# Patient Record
Sex: Male | Born: 1980 | Race: White | Hispanic: No | Marital: Married | Smoking: Former smoker
Health system: Southern US, Community
[De-identification: ages and names within clinical notes are randomized; demographics above are authoritative.]

## PROBLEM LIST (undated history)

## (undated) DIAGNOSIS — F909 Attention-deficit hyperactivity disorder, unspecified type: Secondary | ICD-10-CM

## (undated) DIAGNOSIS — N200 Calculus of kidney: Secondary | ICD-10-CM

## (undated) DIAGNOSIS — M545 Low back pain, unspecified: Secondary | ICD-10-CM

## (undated) DIAGNOSIS — G8929 Other chronic pain: Secondary | ICD-10-CM

## (undated) DIAGNOSIS — M1990 Unspecified osteoarthritis, unspecified site: Secondary | ICD-10-CM

## (undated) HISTORY — PX: HERNIA REPAIR: SHX51

## (undated) HISTORY — DX: Unspecified osteoarthritis, unspecified site: M19.90

## (undated) HISTORY — DX: Attention-deficit hyperactivity disorder, unspecified type: F90.9

---

## 2004-12-31 ENCOUNTER — Emergency Department: Payer: Self-pay | Admitting: General Practice

## 2005-06-14 ENCOUNTER — Emergency Department: Payer: Self-pay | Admitting: Emergency Medicine

## 2005-10-23 ENCOUNTER — Emergency Department: Payer: Self-pay | Admitting: Emergency Medicine

## 2005-10-25 ENCOUNTER — Emergency Department: Payer: Self-pay | Admitting: Emergency Medicine

## 2005-10-26 ENCOUNTER — Emergency Department: Payer: Self-pay | Admitting: Emergency Medicine

## 2006-01-14 ENCOUNTER — Emergency Department: Payer: Self-pay | Admitting: Emergency Medicine

## 2006-02-21 ENCOUNTER — Emergency Department: Payer: Self-pay | Admitting: Emergency Medicine

## 2006-03-06 ENCOUNTER — Emergency Department: Payer: Self-pay | Admitting: Emergency Medicine

## 2006-08-31 ENCOUNTER — Emergency Department: Payer: Self-pay | Admitting: Emergency Medicine

## 2007-01-28 ENCOUNTER — Emergency Department: Payer: Self-pay | Admitting: Emergency Medicine

## 2009-03-16 ENCOUNTER — Emergency Department: Payer: Self-pay | Admitting: Emergency Medicine

## 2009-08-04 ENCOUNTER — Emergency Department (HOSPITAL_COMMUNITY): Admission: EM | Admit: 2009-08-04 | Discharge: 2009-08-04 | Payer: Self-pay | Admitting: Emergency Medicine

## 2009-08-05 ENCOUNTER — Emergency Department (HOSPITAL_COMMUNITY): Admission: EM | Admit: 2009-08-05 | Discharge: 2009-08-05 | Payer: Self-pay | Admitting: Emergency Medicine

## 2009-08-07 ENCOUNTER — Emergency Department (HOSPITAL_COMMUNITY): Admission: EM | Admit: 2009-08-07 | Discharge: 2009-08-07 | Payer: Self-pay | Admitting: Emergency Medicine

## 2009-08-18 ENCOUNTER — Emergency Department: Payer: Self-pay | Admitting: Emergency Medicine

## 2009-08-31 ENCOUNTER — Emergency Department (HOSPITAL_COMMUNITY): Admission: EM | Admit: 2009-08-31 | Discharge: 2009-08-31 | Payer: Self-pay | Admitting: Emergency Medicine

## 2009-09-01 ENCOUNTER — Emergency Department (HOSPITAL_COMMUNITY): Admission: EM | Admit: 2009-09-01 | Discharge: 2009-09-01 | Payer: Self-pay | Admitting: Emergency Medicine

## 2009-09-13 ENCOUNTER — Emergency Department (HOSPITAL_COMMUNITY): Admission: EM | Admit: 2009-09-13 | Discharge: 2009-09-13 | Payer: Self-pay | Admitting: Emergency Medicine

## 2009-10-23 ENCOUNTER — Emergency Department: Payer: Self-pay

## 2009-10-26 ENCOUNTER — Emergency Department: Payer: Self-pay | Admitting: Emergency Medicine

## 2009-10-28 ENCOUNTER — Emergency Department: Payer: Self-pay | Admitting: Internal Medicine

## 2010-05-27 ENCOUNTER — Inpatient Hospital Stay: Payer: Self-pay | Admitting: Psychiatry

## 2010-06-02 ENCOUNTER — Emergency Department: Payer: Self-pay | Admitting: Emergency Medicine

## 2010-11-03 ENCOUNTER — Emergency Department (HOSPITAL_COMMUNITY)
Admission: EM | Admit: 2010-11-03 | Discharge: 2010-11-03 | Payer: Self-pay | Source: Home / Self Care | Admitting: Emergency Medicine

## 2010-12-09 ENCOUNTER — Emergency Department (HOSPITAL_COMMUNITY)
Admission: EM | Admit: 2010-12-09 | Discharge: 2010-12-09 | Payer: Self-pay | Source: Home / Self Care | Admitting: Emergency Medicine

## 2011-02-13 ENCOUNTER — Emergency Department (HOSPITAL_COMMUNITY)
Admission: EM | Admit: 2011-02-13 | Discharge: 2011-02-13 | Disposition: A | Discharge status: Home / Self Care | Payer: Self-pay | Attending: Emergency Medicine | Admitting: Emergency Medicine

## 2011-02-13 DIAGNOSIS — M545 Low back pain, unspecified: Secondary | ICD-10-CM | POA: Insufficient documentation

## 2011-02-13 DIAGNOSIS — G8929 Other chronic pain: Secondary | ICD-10-CM | POA: Insufficient documentation

## 2011-02-13 DIAGNOSIS — R109 Unspecified abdominal pain: Secondary | ICD-10-CM | POA: Insufficient documentation

## 2011-02-13 DIAGNOSIS — R197 Diarrhea, unspecified: Secondary | ICD-10-CM | POA: Insufficient documentation

## 2011-02-13 LAB — URINALYSIS, ROUTINE W REFLEX MICROSCOPIC
Bilirubin Urine: NEGATIVE
Hgb urine dipstick: NEGATIVE
Protein, ur: NEGATIVE mg/dL
Specific Gravity, Urine: 1.024 (ref 1.005–1.030)
Urobilinogen, UA: 1 mg/dL (ref 0.0–1.0)
pH: 7 (ref 5.0–8.0)

## 2011-02-23 LAB — POCT CARDIAC MARKERS
Myoglobin, poc: 65.7 ng/mL (ref 12–200)
Troponin i, poc: <0.05 ng/mL (ref 0.00–0.09)

## 2011-02-23 LAB — D-DIMER, QUANTITATIVE: D-Dimer, Quant: 0.24 ug/mL-FEU (ref 0.00–0.48)

## 2011-02-24 LAB — CBC
HCT: 40.9 % (ref 39.0–52.0)
HCT: 43.3 % (ref 39.0–52.0)
Hemoglobin: 14.9 g/dL (ref 13.0–17.0)
MCHC: 34.3 g/dL (ref 30.0–36.0)
MCHC: 34.5 g/dL (ref 30.0–36.0)
MCV: 92.2 fL (ref 78.0–100.0)
Platelets: 255 K/uL (ref 150–400)
RBC: 4.69 MIL/uL (ref 4.22–5.81)
RDW: 12.2 % (ref 11.5–15.5)
RDW: 12.9 % (ref 11.5–15.5)
WBC: 14 K/uL — ABNORMAL HIGH (ref 4.0–10.5)

## 2011-02-24 LAB — URINALYSIS, ROUTINE W REFLEX MICROSCOPIC
Bilirubin Urine: NEGATIVE
Glucose, UA: NEGATIVE mg/dL
Ketones, ur: NEGATIVE mg/dL
Ketones, ur: NEGATIVE mg/dL
Leukocytes, UA: NEGATIVE
Leukocytes, UA: NEGATIVE
Nitrite: NEGATIVE
Protein, ur: NEGATIVE mg/dL
Protein, ur: NEGATIVE mg/dL
Urobilinogen, UA: 0.2 mg/dL (ref 0.0–1.0)
pH: 8 (ref 5.0–8.0)

## 2011-02-24 LAB — POCT I-STAT, CHEM 8
BUN: 16 mg/dL (ref 6–23)
BUN: 6 mg/dL (ref 6–23)
Calcium, Ion: 1.13 mmol/L (ref 1.12–1.32)
Calcium, Ion: 1.16 mmol/L (ref 1.12–1.32)
Chloride: 106 mEq/L (ref 96–112)
Chloride: 107 meq/L (ref 96–112)
Creatinine, Ser: 0.8 mg/dL (ref 0.4–1.5)
Creatinine, Ser: 0.8 mg/dL (ref 0.4–1.5)
Glucose, Bld: 152 mg/dL — ABNORMAL HIGH (ref 70–99)
Glucose, Bld: 94 mg/dL (ref 70–99)
HCT: 44 % (ref 39.0–52.0)
Hemoglobin: 15 g/dL (ref 13.0–17.0)
Potassium: 3.6 meq/L (ref 3.5–5.1)
Sodium: 141 meq/L (ref 135–145)
TCO2: 22 mmol/L (ref 0–100)
TCO2: 25 mmol/L (ref 0–100)

## 2011-02-24 LAB — BASIC METABOLIC PANEL
CO2: 25 mEq/L (ref 19–32)
Calcium: 8.9 mg/dL (ref 8.4–10.5)
Chloride: 109 mEq/L (ref 96–112)
GFR calc non Af Amer: >60 mL/min (ref >60)
Glucose, Bld: 95 mg/dL (ref 70–99)

## 2011-02-24 LAB — URINE MICROSCOPIC-ADD ON

## 2011-02-24 LAB — DIFFERENTIAL
Basophils Absolute: 0.1 K/uL (ref 0.0–0.1)
Basophils Relative: 0 % (ref 0–1)
Eosinophils Absolute: 0 10*3/uL (ref 0.0–0.7)
Eosinophils Relative: 0 % (ref 0–5)
Eosinophils Relative: 1 % (ref 0–5)
Lymphocytes Relative: 8 % — ABNORMAL LOW (ref 12–46)
Lymphs Abs: 1.1 K/uL (ref 0.7–4.0)
Lymphs Abs: 1.4 10*3/uL (ref 0.7–4.0)
Monocytes Absolute: 0.7 10*3/uL (ref 0.1–1.0)
Monocytes Absolute: 0.7 K/uL (ref 0.1–1.0)
Monocytes Relative: 5 % (ref 3–12)
Neutro Abs: 12.1 10*3/uL — ABNORMAL HIGH (ref 1.7–7.7)
Neutrophils Relative %: 78 % — ABNORMAL HIGH (ref 43–77)
Neutrophils Relative %: 87 % — ABNORMAL HIGH (ref 43–77)

## 2011-02-25 ENCOUNTER — Inpatient Hospital Stay (INDEPENDENT_AMBULATORY_CARE_PROVIDER_SITE_OTHER)
Admission: RE | Admit: 2011-02-25 | Discharge: 2011-02-25 | Disposition: A | Discharge status: Home / Self Care | Payer: Self-pay | Source: Ambulatory Visit | Attending: Emergency Medicine | Admitting: Emergency Medicine

## 2011-02-25 DIAGNOSIS — M5430 Sciatica, unspecified side: Secondary | ICD-10-CM

## 2011-02-25 DIAGNOSIS — M549 Dorsalgia, unspecified: Secondary | ICD-10-CM

## 2011-05-08 ENCOUNTER — Inpatient Hospital Stay (INDEPENDENT_AMBULATORY_CARE_PROVIDER_SITE_OTHER)
Admission: RE | Admit: 2011-05-08 | Discharge: 2011-05-08 | Disposition: A | Discharge status: Home / Self Care | Payer: Self-pay | Source: Ambulatory Visit

## 2011-05-08 DIAGNOSIS — S335XXA Sprain of ligaments of lumbar spine, initial encounter: Secondary | ICD-10-CM

## 2011-05-16 ENCOUNTER — Emergency Department (HOSPITAL_COMMUNITY)
Admission: EM | Admit: 2011-05-16 | Discharge: 2011-05-16 | Discharge status: Against Medical Advice | Payer: Self-pay | Attending: Emergency Medicine | Admitting: Emergency Medicine

## 2011-05-16 DIAGNOSIS — M542 Cervicalgia: Secondary | ICD-10-CM | POA: Insufficient documentation

## 2011-05-29 ENCOUNTER — Emergency Department (HOSPITAL_COMMUNITY)
Admission: EM | Admit: 2011-05-29 | Discharge: 2011-05-30 | Disposition: A | Discharge status: Home / Self Care | Payer: Self-pay | Attending: Emergency Medicine | Admitting: Emergency Medicine

## 2011-05-29 DIAGNOSIS — M549 Dorsalgia, unspecified: Secondary | ICD-10-CM | POA: Insufficient documentation

## 2011-05-29 DIAGNOSIS — M62838 Other muscle spasm: Secondary | ICD-10-CM | POA: Insufficient documentation

## 2011-07-24 ENCOUNTER — Inpatient Hospital Stay (INDEPENDENT_AMBULATORY_CARE_PROVIDER_SITE_OTHER)
Admit: 2011-07-24 | Discharge: 2011-07-24 | Disposition: A | Discharge status: Home / Self Care | Payer: BC Managed Care – PPO

## 2011-07-24 ENCOUNTER — Emergency Department (HOSPITAL_COMMUNITY)
Admission: EM | Admit: 2011-07-24 | Discharge: 2011-07-24 | Discharge status: Against Medical Advice | Payer: BC Managed Care – PPO | Attending: Emergency Medicine | Admitting: Emergency Medicine

## 2011-07-24 DIAGNOSIS — M545 Low back pain, unspecified: Secondary | ICD-10-CM | POA: Insufficient documentation

## 2011-07-24 DIAGNOSIS — M5430 Sciatica, unspecified side: Secondary | ICD-10-CM

## 2011-09-04 ENCOUNTER — Emergency Department (HOSPITAL_COMMUNITY)
Admission: EM | Admit: 2011-09-04 | Discharge: 2011-09-04 | Disposition: A | Discharge status: Home / Self Care | Payer: BC Managed Care – PPO | Attending: Emergency Medicine | Admitting: Emergency Medicine

## 2011-09-04 ENCOUNTER — Emergency Department (HOSPITAL_COMMUNITY): Payer: BC Managed Care – PPO

## 2011-09-04 DIAGNOSIS — R109 Unspecified abdominal pain: Secondary | ICD-10-CM | POA: Insufficient documentation

## 2011-09-04 DIAGNOSIS — N200 Calculus of kidney: Secondary | ICD-10-CM | POA: Insufficient documentation

## 2011-09-04 DIAGNOSIS — R112 Nausea with vomiting, unspecified: Secondary | ICD-10-CM | POA: Insufficient documentation

## 2011-09-04 DIAGNOSIS — R197 Diarrhea, unspecified: Secondary | ICD-10-CM | POA: Insufficient documentation

## 2011-09-04 LAB — DIFFERENTIAL
Basophils Absolute: 0 10*3/uL (ref 0.0–0.1)
Basophils Relative: 0 % (ref 0–1)
Eosinophils Absolute: 0.1 10*3/uL (ref 0.0–0.7)
Lymphs Abs: 1.3 10*3/uL (ref 0.7–4.0)
Monocytes Relative: 6 % (ref 3–12)

## 2011-09-04 LAB — COMPREHENSIVE METABOLIC PANEL
Albumin: 5.2 g/dL (ref 3.5–5.2)
BUN: 10 mg/dL (ref 6–23)
CO2: 24 mEq/L (ref 19–32)
Calcium: 10.8 mg/dL — ABNORMAL HIGH (ref 8.4–10.5)
Chloride: 101 mEq/L (ref 96–112)
Creatinine, Ser: 0.6 mg/dL (ref 0.50–1.35)
Glucose, Bld: 100 mg/dL — ABNORMAL HIGH (ref 70–99)
Sodium: 137 mEq/L (ref 135–145)
Total Protein: 8 g/dL (ref 6.0–8.3)

## 2011-09-04 LAB — URINALYSIS, ROUTINE W REFLEX MICROSCOPIC
Nitrite: NEGATIVE
Protein, ur: NEGATIVE mg/dL

## 2011-09-04 LAB — CBC
HCT: 48.4 % (ref 39.0–52.0)
MCH: 31.6 pg (ref 26.0–34.0)
MCV: 85.4 fL (ref 78.0–100.0)
Platelets: 286 10*3/uL (ref 150–400)

## 2011-09-04 LAB — POCT I-STAT, CHEM 8
Chloride: 104 mEq/L (ref 96–112)
HCT: 54 % — ABNORMAL HIGH (ref 39.0–52.0)
Hemoglobin: 18.4 g/dL — ABNORMAL HIGH (ref 13.0–17.0)
Sodium: 139 mEq/L (ref 135–145)

## 2011-09-04 MED ORDER — IOHEXOL 300 MG/ML  SOLN
80.0000 mL | Freq: Once | INTRAMUSCULAR | Status: AC | PRN
  Administered 2011-09-04: 80 mL via INTRAVENOUS

## 2011-09-10 ENCOUNTER — Inpatient Hospital Stay (INDEPENDENT_AMBULATORY_CARE_PROVIDER_SITE_OTHER)
Admission: RE | Admit: 2011-09-10 | Discharge: 2011-09-10 | Disposition: A | Discharge status: Home / Self Care | Payer: BC Managed Care – PPO | Source: Ambulatory Visit | Attending: Family Medicine | Admitting: Family Medicine

## 2011-09-10 DIAGNOSIS — N200 Calculus of kidney: Secondary | ICD-10-CM

## 2011-09-10 LAB — POCT URINALYSIS DIP (DEVICE)
Bilirubin Urine: NEGATIVE
Ketones, ur: NEGATIVE mg/dL
Protein, ur: 30 mg/dL — AB
Specific Gravity, Urine: >=1.03 (ref 1.005–1.030)
pH: 6.5 (ref 5.0–8.0)

## 2011-09-10 LAB — RAPID URINE DRUG SCREEN, HOSP PERFORMED
Amphetamines: NOT DETECTED
Benzodiazepines: NOT DETECTED
Tetrahydrocannabinol: NOT DETECTED

## 2011-11-04 ENCOUNTER — Encounter: Payer: Self-pay | Admitting: General Practice

## 2011-11-04 ENCOUNTER — Emergency Department (HOSPITAL_COMMUNITY)
Admission: EM | Admit: 2011-11-04 | Discharge: 2011-11-04 | Disposition: A | Discharge status: Home / Self Care | Payer: BC Managed Care – PPO | Attending: Emergency Medicine | Admitting: Emergency Medicine

## 2011-11-04 DIAGNOSIS — M545 Low back pain, unspecified: Secondary | ICD-10-CM | POA: Insufficient documentation

## 2011-11-04 DIAGNOSIS — M79609 Pain in unspecified limb: Secondary | ICD-10-CM | POA: Insufficient documentation

## 2011-11-04 DIAGNOSIS — G8929 Other chronic pain: Secondary | ICD-10-CM | POA: Insufficient documentation

## 2011-11-04 MED ORDER — IBUPROFEN 600 MG PO TABS: 600.0000 mg | ORAL_TABLET | Freq: Four times a day (QID) | ORAL | Status: AC | PRN

## 2011-11-04 MED ORDER — CYCLOBENZAPRINE HCL 10 MG PO TABS: 10.0000 mg | ORAL_TABLET | Freq: Two times a day (BID) | ORAL | Status: AC | PRN

## 2011-11-04 MED ORDER — TRAMADOL HCL 50 MG PO TABS: 50.0000 mg | ORAL_TABLET | Freq: Four times a day (QID) | ORAL | Status: AC | PRN

## 2011-11-04 NOTE — ED Notes (Signed)
Pt has low back pain. Pt states that he has had same x 1 week.  Pt uses heaving equipment at work.  Pt is carrying an infant without distress.  Pt denies any known injury.  Pt states that pain does radiate to his right thigh.

## 2011-11-04 NOTE — ED Notes (Signed)
Pt c/o of lower back x 1 week. Denies any injuries. C/o of shooting pains down right leg.

## 2011-11-04 NOTE — ED Notes (Signed)
Pt states wants to leave AMA d/t has infant with him and wants to go home.  Requested for pt to wait for Dr Radford Pax - pt voiced he will.

## 2011-11-04 NOTE — ED Notes (Signed)
Dr Radford Pax aware pt is requesting to leave.

## 2011-11-04 NOTE — ED Provider Notes (Signed)
History     CSN: [id number] Arrival date & time: 11/04/2011  1:09 PM   First MD Initiated Contact with Patient 11/04/11 1425      Chief Complaint  Patient presents with  . Back Pain    (Consider location/radiation/quality/duration/timing/severity/associated sxs/prior treatment) Patient is a 30 y.o. male presenting with back pain.  Back Pain    Pt c/o of lower back x 1 week. Denies any injuries. C/o of shooting pains down right leg.  Patient has had pain summers this in the past.  Has not had an MRI scan done.  Patient is going to see his doctor next week.  History reviewed. No pertinent past medical history.  History reviewed. No pertinent past surgical history.  History reviewed. No pertinent family history.  History  Substance Use Topics  . Smoking status: Not on file  . Smokeless tobacco: Not on file  . Alcohol Use: Not on file      Review of Systems  Musculoskeletal: Positive for back pain.  All other systems reviewed and are negative.    Allergies  Review of patient's allergies indicates no known allergies.  Home Medications   Current Outpatient Rx  Name Route Sig Dispense Refill  . IBUPROFEN 200 MG PO TABS Oral Take 600 mg by mouth every 6 (six) hours as needed. FOR PAIN     . ADULT MULTIVITAMIN W/MINERALS CH Oral Take 1 tablet by mouth daily.      . CYCLOBENZAPRINE HCL 10 MG PO TABS Oral Take 1 tablet (10 mg total) by mouth 2 (two) times daily as needed for muscle spasms. 20 tablet 0  . IBUPROFEN 600 MG PO TABS Oral Take 1 tablet (600 mg total) by mouth every 6 (six) hours as needed for pain. 30 tablet 0  . TRAMADOL HCL 50 MG PO TABS Oral Take 1 tablet (50 mg total) by mouth every 6 (six) hours as needed for pain. Maximum dose= 8 tablets per day 15 tablet 0    BP 151/80  Pulse 85  Temp 98.2 F (36.8 C)  Resp 16  SpO2 100%  Physical Exam  Nursing note and vitals reviewed. Constitutional: He is oriented to person, place, and time. He appears  well-developed and well-nourished. No distress.  HENT:  Head: Normocephalic and atraumatic.  Eyes: Pupils are equal, round, and reactive to light.  Neck: Normal range of motion.  Cardiovascular: Normal rate and intact distal pulses.   Pulmonary/Chest: No respiratory distress.  Abdominal: Normal appearance. He exhibits no distension.  Musculoskeletal: Normal range of motion.       Lumbar back: He exhibits tenderness and pain. He exhibits no swelling.       Back:  Neurological: He is alert and oriented to person, place, and time. No cranial nerve deficit.  Skin: Skin is warm and dry. No rash noted.  Psychiatric: He has a normal mood and affect. His behavior is normal.    ED Course  Procedures (including critical care time) I recommended that the patient have an MRI scan for further definitive diagnostic work. Labs Reviewed - No data to display No results found.   1. Chronic back pain       MDM          Nelia Shi, MD 11/04/11 [phone]

## 2012-02-03 ENCOUNTER — Emergency Department (HOSPITAL_COMMUNITY)
Admission: EM | Admit: 2012-02-03 | Discharge: 2012-02-03 | Disposition: A | Discharge status: Home / Self Care | Payer: BC Managed Care – PPO | Attending: Emergency Medicine | Admitting: Emergency Medicine

## 2012-02-03 ENCOUNTER — Encounter (HOSPITAL_COMMUNITY): Payer: Self-pay | Admitting: Nurse Practitioner

## 2012-02-03 DIAGNOSIS — T148XXA Other injury of unspecified body region, initial encounter: Secondary | ICD-10-CM

## 2012-02-03 DIAGNOSIS — Z87442 Personal history of urinary calculi: Secondary | ICD-10-CM | POA: Insufficient documentation

## 2012-02-03 DIAGNOSIS — R109 Unspecified abdominal pain: Secondary | ICD-10-CM | POA: Insufficient documentation

## 2012-02-03 DIAGNOSIS — M549 Dorsalgia, unspecified: Secondary | ICD-10-CM | POA: Insufficient documentation

## 2012-02-03 HISTORY — DX: Calculus of kidney: N20.0

## 2012-02-03 LAB — URINALYSIS, ROUTINE W REFLEX MICROSCOPIC
Glucose, UA: NEGATIVE mg/dL
Hgb urine dipstick: NEGATIVE
Ketones, ur: NEGATIVE mg/dL
Leukocytes, UA: NEGATIVE
Protein, ur: NEGATIVE mg/dL
Urobilinogen, UA: 0.2 mg/dL (ref 0.0–1.0)

## 2012-02-03 MED ORDER — TRAMADOL HCL 50 MG PO TABS: 50.0000 mg | ORAL_TABLET | Freq: Four times a day (QID) | ORAL | Status: DC | PRN

## 2012-02-03 MED ORDER — CYCLOBENZAPRINE HCL 10 MG PO TABS: 10.0000 mg | ORAL_TABLET | Freq: Three times a day (TID) | ORAL | Status: AC | PRN

## 2012-02-03 MED ORDER — KETOROLAC TROMETHAMINE 30 MG/ML IJ SOLN: 30.0000 mg | Freq: Once | INTRAMUSCULAR | Status: DC

## 2012-02-03 MED ORDER — IBUPROFEN 800 MG PO TABS
800.0000 mg | ORAL_TABLET | Freq: Once | ORAL | Status: AC
  Administered 2012-02-03: 800 mg via ORAL
  Filled 2012-02-03: qty 1

## 2012-02-03 NOTE — ED Notes (Signed)
Opt reports R buttock pain radiating down R leg rates 7/10 will medicate for pain but pt reports he is driving narcotics avoided at this time.

## 2012-02-03 NOTE — ED Notes (Signed)
C/o lower back pain radiating down R leg x 1 week. Gets worse with urination and movement. History of kidney stones but states this feels more like back pain.

## 2012-02-03 NOTE — ED Notes (Signed)
Patient is speaking with the PA.  He originally declined his x-ray, but now he is reconsidering having it done.

## 2012-02-03 NOTE — ED Provider Notes (Signed)
History     CSN: [id number]  Arrival date & time 02/03/12  1731   First MD Initiated Contact with Patient 02/03/12 2012      Chief Complaint  Patient presents with  . Back Pain     HPI  History provided by the patient. Patient is a 31 year old male with history of kidney stones and hernia repair who presents with complaints of right flank and back pains for the past week. Patient reports the pain is worse with certain movements and bending. Patient states he is uncomfortable in any position.  Pain feels somewhat similar to prior kidney stones.  Patient denies any trauma or strenuous activity. Patient denies any dysuria, hematuria, urinary frequency. Patient denies any fever, chills, sweats, nausea, vomiting. Patient denies any urinary or equal incontinence, urinary retention, perineal numbness, weakness or numbness in the lower extremities. Symptoms are described as severe. Patient denies any other aggravating or alleviating factors.   Past Medical History  Diagnosis Date  . Kidney stone     Past Surgical History  Procedure Date  . Hernia repair     History reviewed. No pertinent family history.  History  Substance Use Topics  . Smoking status: Current Everyday Smoker -- 1.0 packs/day  . Smokeless tobacco: Not on file  . Alcohol Use: No      Review of Systems  Constitutional: Negative for fever and chills.  Gastrointestinal: Negative for nausea, vomiting, abdominal pain, diarrhea and constipation.  Genitourinary: Negative for dysuria, frequency and hematuria.  Skin: Negative for rash.  All other systems reviewed and are negative.    Allergies  Review of patient's allergies indicates no known allergies.  Home Medications   Current Outpatient Rx  Name Route Sig Dispense Refill  . ACETAMINOPHEN 500 MG PO TABS Oral Take 1,000 mg by mouth every 6 (six) hours as needed. For pain.    . IBUPROFEN 200 MG PO TABS Oral Take 600 mg by mouth every 6 (six) hours as needed.  FOR PAIN    . ADULT MULTIVITAMIN W/MINERALS CH Oral Take 1 tablet by mouth daily.        BP 132/78  Pulse 97  Temp(Src) 99.2 F (37.3 C) (Oral)  Resp 16  Ht 5\' 11"  (1.803 m)  Wt 160 lb (72.576 kg)  BMI 22.32 kg/m2  SpO2 99%  Physical Exam  Nursing note and vitals reviewed. Constitutional: He is oriented to person, place, and time. He appears well-developed and well-nourished. No distress.  HENT:  Head: Normocephalic.  Cardiovascular: Normal rate and regular rhythm.   Pulmonary/Chest: Effort normal and breath sounds normal.  Abdominal: Soft. He exhibits no distension. There is no tenderness. There is no rebound and no guarding.  Musculoskeletal:       Cervical back: Normal.       Thoracic back: He exhibits tenderness.       Lumbar back: He exhibits tenderness.       Back:       Reduced range of motion and low back secondary to pain. Normal gait.  Neurological: He is alert and oriented to person, place, and time.  Skin: Skin is warm. No rash noted.  Psychiatric: He has a normal mood and affect. His behavior is normal.    ED Course  Procedures   Results for orders placed during the hospital encounter of 02/03/12  URINALYSIS, ROUTINE W REFLEX MICROSCOPIC      Component Value Range   Color, Urine YELLOW  YELLOW    APPearance CLEAR  CLEAR    Specific Gravity, Urine 1.015  1.005 - 1.030    pH 7.5  5.0 - 8.0    Glucose, UA NEGATIVE  NEGATIVE (mg/dL)   Hgb urine dipstick NEGATIVE  NEGATIVE    Bilirubin Urine NEGATIVE  NEGATIVE    Ketones, ur NEGATIVE  NEGATIVE (mg/dL)   Protein, ur NEGATIVE  NEGATIVE (mg/dL)   Urobilinogen, UA 0.2  0.0 - 1.0 (mg/dL)   Nitrite NEGATIVE  NEGATIVE    Leukocytes, UA NEGATIVE  NEGATIVE        1. Right flank pain   2. Muscle strain       MDM  Patient seen and evaluated. Patient no acute distress.  Patient with unremarkable urine sample. Patient has no red flag warning symptoms back pain. I discussed with patient further workup to  rule out kidney stone despite having no blood in urine. Due to patient having multiple CT scans in the past I discussed the option for a plain film x-ray. Based on prior CT scans with most recent in last October patient did have a 1.1 cm nonobstructing stone in the right kidney. I also offered additional treatment of symptoms. Patient stated that he needed to return home to his wife and 2 kids. Patient stated that he made his followup if symptoms worsen and he is more time to spend in the emergency room. I also recommended that he followup with the urology specialist on Monday.    Medical screening examination/treatment/procedure(s) were performed by non-physician practitioner and as supervising physician I was immediately available for consultation/collaboration. Osvaldo Human, M.D.       Angus Seller, PA 02/05/12 0124  Carleene Cooper III, MD 02/06/12 1124

## 2012-02-03 NOTE — Discharge Instructions (Signed)
You were seen and evaluated for your right back and flank pains. Your urine test today was normal. Your providers discussed with you options for further testing including an x-ray to look for kidney stones and blood tests to evaluate your kidney function. You had declined to have further workup or treatment of your symptoms. At this time your providers recommend that you followup with your primary care provider or urology specialist on Monday for continued evaluation and treatment.   Flank Pain Flank pain refers to pain that is located on the side of the body between the upper abdomen and the back. It can be caused by many things. CAUSES  Some of the more common causes of flank pain include:  Muscle strain.   Muscle spasms.   A disease of your spine (vertebral disk disease).   A lung infection (pneumonia).   Fluid around your lungs (pulmonary edema).   A kidney infection.   Kidney stones.   A very painful skin rash on only one side of your body (shingles).   Gallbladder disease.  DIAGNOSIS  Blood tests, urine tests, and X-rays may help your caregiver determine what is wrong. TREATMENT  The treatment of pain depends on the cause. Your caregiver will determine what treatment will work best for you. HOME CARE INSTRUCTIONS   Home care will depend on the cause of your pain.   Some medications may help relieve the pain. Take medication for relief of pain as directed by your caregiver.   Tell your caregiver about any changes in your pain.   Follow up with your caregiver.  SEEK IMMEDIATE MEDICAL CARE IF:   Your pain is not controlled with medication.   The pain increases.   You have abdominal pain.   You have shortness of breath.   You have persistent nausea or vomiting.   You have swelling in your abdomen.   You feel faint or pass out.   You have a temperature by mouth above 102 F (38.9 C), not controlled by medicine.  MAKE SURE YOU:   Understand these  instructions.   Will watch your condition.   Will get help right away if you are not doing well or get worse.  Document Released: 12/28/2005 Document Revised: 10/26/2011 Document Reviewed: 04/23/2010 [facility] Patient Information 2012 [place], [place].   Muscle Strain A muscle strain (pulled muscle) happens when a muscle is over-stretched. Recovery usually takes 5 to 6 weeks.  HOME CARE   Put ice on the injured area.   Put ice in a plastic bag.   Place a towel between your skin and the bag.   Leave the ice on for 15 to 20 minutes at a time, every hour for the first 2 days.   Do not use the muscle for several days or until your doctor says you can. Do not use the muscle if you have pain.   Wrap the injured area with an elastic bandage for comfort. Do not put it on too tightly.   Only take medicine as told by your doctor.   Warm up before exercise. This helps prevent muscle strains.  GET HELP RIGHT AWAY IF:  There is increased pain or puffiness (swelling) in the affected area. MAKE SURE YOU:   Understand these instructions.   Will watch your condition.   Will get help right away if you are not doing well or get worse.  Document Released: 08/15/2008 Document Revised: 10/26/2011 Document Reviewed: 08/15/2008 [facility] Patient Information 2012 [place], [place].  RESOURCE GUIDE  Dental Problems  Patients with Medicaid: [facility] [phone]                                           [phone] [address] Phone:  [phone]                                                  Phone:  [phone]  If unable to pay or uninsured, contact:  [facility] or [facility]. to become qualified for the adult dental clinic.  Chronic Pain Problems Contact [facility]  [phone] Patients need to be referred by their primary care doctor.  Insufficient Money for Medicine Contact United Way:  call  "211" or [facility] (669)290-2073.  No Primary Care Doctor Call [facility]  831-470-0034 Other agencies that provide inexpensive medical care    [facility]  475-485-8822    [facility]  [phone]    [facility]  650-215-0631    [facility]  ([phone]    [facility]  (209) 079-5424    [facility]  [phone]  [facility]  [phone] [facility]  [phone] [facility]   [phone] (emergency services 601-281-7449)  Substance Abuse Resources [facility]  510-511-7102 [facility] (304)324-5824 [facility] 813-627-0301 Floydene Flock (609) 254-0830 Residential & Outpatient Substance Abuse Program  930-247-3284  Abuse/Neglect [facility] Child Abuse Hotline (351)791-1483 [facility] Child Abuse Hotline (309) 481-9243 (After Hours)  Emergency Shelter [facility] (505)172-6031  [facility] [place] [facility] (667)820-7197 [facility] 973-298-8372  MRSA Hotline #:   564-758-3170    [facility] Resources  [facility] [place]     United Way                          [facility] Dept. [address] [place]                       [address]      [address]  Blondell Reveal Phone:  [phone]                                   Phone:  [phone]                 Phone:  [phone]  [facility] Phone:  [phone]  [place] [phone] (After Hours)

## 2012-02-03 NOTE — ED Notes (Signed)
Patient is AOx4 and comfortable with his discharge instructions. 

## 2012-03-12 ENCOUNTER — Other Ambulatory Visit (HOSPITAL_BASED_OUTPATIENT_CLINIC_OR_DEPARTMENT_OTHER): Payer: Self-pay | Admitting: Internal Medicine

## 2012-03-12 DIAGNOSIS — M545 Low back pain: Secondary | ICD-10-CM

## 2012-03-15 ENCOUNTER — Other Ambulatory Visit (HOSPITAL_BASED_OUTPATIENT_CLINIC_OR_DEPARTMENT_OTHER): Payer: Self-pay | Admitting: Internal Medicine

## 2012-03-15 DIAGNOSIS — T1590XA Foreign body on external eye, part unspecified, unspecified eye, initial encounter: Secondary | ICD-10-CM

## 2012-03-16 ENCOUNTER — Other Ambulatory Visit (HOSPITAL_BASED_OUTPATIENT_CLINIC_OR_DEPARTMENT_OTHER): Payer: BC Managed Care – PPO

## 2012-03-23 ENCOUNTER — Other Ambulatory Visit (HOSPITAL_BASED_OUTPATIENT_CLINIC_OR_DEPARTMENT_OTHER): Payer: BC Managed Care – PPO

## 2012-03-23 ENCOUNTER — Inpatient Hospital Stay (HOSPITAL_BASED_OUTPATIENT_CLINIC_OR_DEPARTMENT_OTHER): Admission: RE | Admit: 2012-03-23 | Payer: BC Managed Care – PPO | Source: Ambulatory Visit

## 2012-03-30 ENCOUNTER — Ambulatory Visit (HOSPITAL_BASED_OUTPATIENT_CLINIC_OR_DEPARTMENT_OTHER): Admission: RE | Admit: 2012-03-30 | Payer: BC Managed Care – PPO | Source: Ambulatory Visit

## 2012-04-06 ENCOUNTER — Inpatient Hospital Stay (HOSPITAL_BASED_OUTPATIENT_CLINIC_OR_DEPARTMENT_OTHER): Admission: RE | Admit: 2012-04-06 | Payer: BC Managed Care – PPO | Source: Ambulatory Visit

## 2012-04-06 ENCOUNTER — Other Ambulatory Visit (HOSPITAL_BASED_OUTPATIENT_CLINIC_OR_DEPARTMENT_OTHER): Payer: BC Managed Care – PPO

## 2012-04-15 ENCOUNTER — Encounter (HOSPITAL_COMMUNITY): Payer: Self-pay | Admitting: Emergency Medicine

## 2012-04-15 ENCOUNTER — Emergency Department (HOSPITAL_COMMUNITY)
Admission: EM | Admit: 2012-04-15 | Discharge: 2012-04-15 | Disposition: A | Discharge status: Home / Self Care | Payer: BC Managed Care – PPO | Attending: Emergency Medicine | Admitting: Emergency Medicine

## 2012-04-15 ENCOUNTER — Emergency Department (HOSPITAL_COMMUNITY): Payer: BC Managed Care – PPO

## 2012-04-15 DIAGNOSIS — R110 Nausea: Secondary | ICD-10-CM | POA: Insufficient documentation

## 2012-04-15 DIAGNOSIS — R109 Unspecified abdominal pain: Secondary | ICD-10-CM | POA: Insufficient documentation

## 2012-04-15 DIAGNOSIS — M545 Low back pain, unspecified: Secondary | ICD-10-CM | POA: Insufficient documentation

## 2012-04-15 DIAGNOSIS — G8929 Other chronic pain: Secondary | ICD-10-CM | POA: Insufficient documentation

## 2012-04-15 HISTORY — DX: Low back pain, unspecified: M54.50

## 2012-04-15 HISTORY — DX: Other chronic pain: G89.29

## 2012-04-15 HISTORY — DX: Low back pain: M54.5

## 2012-04-15 LAB — DIFFERENTIAL
Basophils Absolute: 0 10*3/uL (ref 0.0–0.1)
Basophils Relative: 0 % (ref 0–1)
Eosinophils Absolute: 0.1 10*3/uL (ref 0.0–0.7)
Eosinophils Relative: 1 % (ref 0–5)
Lymphocytes Relative: 24 % (ref 12–46)
Monocytes Absolute: 0.8 10*3/uL (ref 0.1–1.0)

## 2012-04-15 LAB — URINALYSIS, ROUTINE W REFLEX MICROSCOPIC
Bilirubin Urine: NEGATIVE
Glucose, UA: 100 mg/dL — AB
Ketones, ur: NEGATIVE mg/dL
Leukocytes, UA: NEGATIVE
Protein, ur: NEGATIVE mg/dL
pH: 7 (ref 5.0–8.0)

## 2012-04-15 LAB — BASIC METABOLIC PANEL
BUN: 10 mg/dL (ref 6–23)
CO2: 26 mEq/L (ref 19–32)
Calcium: 10.3 mg/dL (ref 8.4–10.5)
Chloride: 102 mEq/L (ref 96–112)
Creatinine, Ser: 0.66 mg/dL (ref 0.50–1.35)
Glucose, Bld: 94 mg/dL (ref 70–99)

## 2012-04-15 LAB — CBC
HCT: 44.5 % (ref 39.0–52.0)
MCH: 30.7 pg (ref 26.0–34.0)
MCHC: 36.2 g/dL — ABNORMAL HIGH (ref 30.0–36.0)
MCV: 84.9 fL (ref 78.0–100.0)
Platelets: 296 10*3/uL (ref 150–400)
RDW: 12.7 % (ref 11.5–15.5)

## 2012-04-15 MED ORDER — ONDANSETRON HCL 4 MG/2ML IJ SOLN
4.0000 mg | Freq: Once | INTRAMUSCULAR | Status: AC
  Administered 2012-04-15: 4 mg via INTRAVENOUS
  Filled 2012-04-15: qty 2

## 2012-04-15 MED ORDER — HYDROMORPHONE HCL PF 1 MG/ML IJ SOLN: 1.0000 mg | Freq: Once | INTRAMUSCULAR | Status: DC

## 2012-04-15 MED ORDER — SODIUM CHLORIDE 0.9 % IV BOLUS (SEPSIS)
1000.0000 mL | Freq: Once | INTRAVENOUS | Status: AC
  Administered 2012-04-15: 1000 mL via INTRAVENOUS

## 2012-04-15 MED ORDER — KETOROLAC TROMETHAMINE 30 MG/ML IJ SOLN
30.0000 mg | Freq: Once | INTRAMUSCULAR | Status: AC
  Administered 2012-04-15: 30 mg via INTRAVENOUS
  Filled 2012-04-15: qty 1

## 2012-04-15 NOTE — ED Provider Notes (Signed)
I saw and evaluated the patient, reviewed the resident's note and I agree with the findings and plan.  I saw the patient along with Ihor Austin and agree with his note, assessment, and plan.  The patient presents complaining of pain in the bilateral flanks he believes is a kidney stone.  He has a history of this.  On exam, he is afebrile and vitals are stable.  The heart is rrr and the lungs are cta.  There is mild bilateral cva ttp.  Workup was initiated including ct of the abdomen and pelvis which was unremarkable and ua which was clear.  He was given toradol and is feeling better.  He will discharged to home with follow up with his pcp if worsens.   Geoffery Lyons, MD 04/15/12 2312

## 2012-04-15 NOTE — ED Notes (Signed)
MD at bedside. 

## 2012-04-15 NOTE — ED Notes (Addendum)
Patient complaining of intermittent lower abdominal pain which radiates around his sides and to flank area for the past two days; patient states that pain is worse upon waking up.  Patient reports history of kidney stones and states pain feels somewhat similar.  Patient states that pain is more prominent on left side/flank area.  Denies changes in urine.

## 2012-04-15 NOTE — Discharge Instructions (Signed)
Abdominal Pain Abdominal pain can be caused by many things. Your caregiver decides the seriousness of your pain by an examination and possibly blood tests and X-rays. Many cases can be observed and treated at home. Most abdominal pain is not caused by a disease and will probably improve without treatment. However, in many cases, more time must pass before a clear cause of the pain can be found. Before that point, it may not be known if you need more testing, or if hospitalization or surgery is needed. HOME CARE INSTRUCTIONS   Do not take laxatives unless directed by your caregiver.   Take pain medicine only as directed by your caregiver.   Only take over-the-counter or prescription medicines for pain, discomfort, or fever as directed by your caregiver.   Try a clear liquid diet (broth, tea, or water) for as long as directed by your caregiver. Slowly move to a bland diet as tolerated.  SEEK IMMEDIATE MEDICAL CARE IF:   The pain does not go away.   You have a fever.   You keep throwing up (vomiting).   The pain is felt only in portions of the abdomen. Pain in the right side could possibly be appendicitis. In an adult, pain in the left lower portion of the abdomen could be colitis or diverticulitis.   You pass bloody or black tarry stools.  MAKE SURE YOU:   Understand these instructions.   Will watch your condition.   Will get help right away if you are not doing well or get worse.  Document Released: 08/16/2005 Document Revised: 10/26/2011 Document Reviewed: 06/24/2008 [employer] Patient Information 2012 [employer] RESOURCE GUIDE  Dental Problems  Patients with Medicaid: [facility]                     [address]                                           Phone:  [phone]                                                  If unable to pay or uninsured, contact:  [facility] or [facility] to become qualified for the adult dental  clinic.  Chronic Pain Problems Contact [facility]  [phone] Patients need to be referred by their primary care doctor.  Insufficient Money for Medicine Contact United Way:  call "211" or [facility] 271-5999.  No Primary Care Doctor Call [facility]  832-8000 Other agencies that provide inexpensive medical care    [facility]  [phone]    [facility]  [phone]    [facility]  [phone]    [facility]'s Clinic  [phone]    [facility]  [phone]    [facility]  [phone]  Substance Abuse Resources Alcohol and Drug Services  336-882-2125 [facility] 336-784-9470 [facility] 336-285-9073 [facility] 336-845-3988 Residential & Outpatient Substance Abuse Program  800-659-3381  [facility] Services [facility]  832-9600 [facility]  [phone] [facility]   [phone] (emergency services [phone])  Abuse/Neglect [place] Child Abuse Hotline (336) 641-3795 [place]   Child Abuse Hotline 800-378-5315 (After Hours)  Emergency Shelter [facility] (336) 271-5985  [facility] (336) 275-9566 [facility] (704) 372-4663  MRSA Hotline #:   832-7006    [place] Resources  [facility] [place]  [employer] Way                           [facility] [address] [place]                     [address]         [address]  [place]                                               [place]                              [place] Phone:  [phone]                                  Phone:  [phone]                   Phone:  [phone]  [place] [facility] Phone:  [phone]  [place] Child Abuse Hotline (336) 342-1394 (336) 342-3537 (After Hours) 

## 2012-04-15 NOTE — ED Provider Notes (Signed)
History     CSN: [id number]  Arrival date & time 04/15/12  4098   First MD Initiated Contact with Patient 04/15/12 2006      Chief Complaint  Patient presents with  . Flank Pain    (Consider location/radiation/quality/duration/timing/severity/associated sxs/prior treatment) HPI Comments: Pain in bilateral flanks.  L > R that feels like prior kidney stones.  Radiating to groin.  Some nausea.  No diarrhea.  Good PO intake and otherwise doing well.  No dysuria.  Patient is a 31 y.o. male presenting with flank pain. The history is provided by the patient.  Flank Pain This is a new problem. The current episode started today. The problem occurs constantly. The problem has been unchanged. Associated symptoms include abdominal pain (bilateral flanks and lower abdomen). Pertinent negatives include no arthralgias, chest pain, congestion, fatigue, fever, headaches, nausea, rash or vomiting. The symptoms are aggravated by nothing. He has tried nothing for the symptoms.    Past Medical History  Diagnosis Date  . Kidney stone   . Chronic lower back pain     Past Surgical History  Procedure Date  . Hernia repair     History reviewed. No pertinent family history.  History  Substance Use Topics  . Smoking status: Current Everyday Smoker -- 1.0 packs/day  . Smokeless tobacco: Not on file  . Alcohol Use: No      Review of Systems  Constitutional: Negative for fever, activity change and fatigue.  HENT: Negative for congestion.   Eyes: Negative for pain.  Respiratory: Negative for chest tightness, shortness of breath, wheezing and stridor.   Cardiovascular: Negative for chest pain and leg swelling.  Gastrointestinal: Positive for abdominal pain (bilateral flanks and lower abdomen). Negative for nausea and vomiting.  Genitourinary: Positive for flank pain. Negative for dysuria.  Musculoskeletal: Negative for arthralgias.  Skin: Negative for rash.  Neurological: Negative for headaches.   Psychiatric/Behavioral: Negative for behavioral problems.    Allergies  Review of patient's allergies indicates no known allergies.  Home Medications   Current Outpatient Rx  Name Route Sig Dispense Refill  . IBUPROFEN 200 MG PO TABS Oral Take 600 mg by mouth every 6 (six) hours as needed. FOR PAIN    . ADULT MULTIVITAMIN W/MINERALS CH Oral Take 1 tablet by mouth daily.        BP 144/82  Pulse 84  Temp(Src) 98.3 F (36.8 C) (Oral)  Resp 18  SpO2 99%  Physical Exam  Constitutional: He is oriented to person, place, and time. He appears well-developed and well-nourished. No distress.  HENT:  Head: Normocephalic and atraumatic.  Eyes: Conjunctivae and EOM are normal. Pupils are equal, round, and reactive to light. No scleral icterus.  Neck: Normal range of motion. Neck supple.  Cardiovascular: Normal rate and regular rhythm.  Exam reveals no gallop and no friction rub.   No murmur heard. Pulmonary/Chest: Effort normal and breath sounds normal. No respiratory distress. He has no wheezes. He has no rales. He exhibits no tenderness.  Abdominal: Soft. He exhibits no distension and no mass. There is tenderness (mild ttp in bilateral flank and across lower abdomen.). There is no rebound and no guarding.  Genitourinary:       No testicular ttp.  No hernia.  Musculoskeletal: Normal range of motion. He exhibits no edema and no tenderness.  Neurological: He is alert and oriented to person, place, and time. He has normal reflexes. No cranial nerve deficit. He exhibits normal muscle tone. Coordination normal.  Skin:  Skin is warm and dry. No rash noted. He is not diaphoretic. No erythema.  Psychiatric: He has a normal mood and affect. His behavior is normal. Judgment and thought content normal.    ED Course  Procedures (including critical care time)  Labs Reviewed  URINALYSIS, ROUTINE W REFLEX MICROSCOPIC - Abnormal; Notable for the following:    Glucose, UA 100 (*)    All other  components within normal limits  BASIC METABOLIC PANEL - Abnormal; Notable for the following:    Potassium 3.4 (*)    All other components within normal limits  CBC - Abnormal; Notable for the following:    WBC 10.7 (*)    MCHC 36.2 (*)    All other components within normal limits  DIFFERENTIAL   Ct Abdomen Pelvis Wo Contrast  04/15/2012  *RADIOLOGY REPORT*  Clinical Data: Right and left flank pain.  Left greater than right. Question of stone.  History of prior stones.  CT ABDOMEN AND PELVIS WITHOUT CONTRAST  Technique:  Multidetector CT imaging of the abdomen and pelvis was performed following the standard protocol without intravenous contrast.  Comparison: 09/04/2011  Findings: Lung bases are clear.  Intrarenal calculi are identified on the right, nonobstructing.  No evidence for ureteral stone. There is a parenchymal calcification of the left, stable in appearance.  Hyperdense lesion is identified in the left kidney that appears stable over multiple prior studies and likely represents a cyst.  No hydronephrosis.  No focal abnormality identified within the liver, spleen, pancreas, or adrenal glands.  The gallbladder is present.  The stomach and small bowel loops have a normal appearance. The appendix is well seen and has a normal appearance.  Colonic loops are normal in appearance. No retroperitoneal or mesenteric adenopathy. No evidence for aortic aneurysm.  IMPRESSION:  1.  No evidence for obstructing ureteral stones. 2.  Intrarenal calculi on the right. 3.  Stable parenchymal calcification and hyperdense cyst in the left kidney. 4.  No evidence for acute appendicitis, bowel obstruction.  Original Report Authenticated By: Patterson Hammersmith, M.D.     1. Abdominal pain   2. Flank pain       MDM  Pain in bilateral flanks.  L > R that feels like prior kidney stones.  Radiating to groin.  Some nausea.  No diarrhea.  Good PO intake and otherwise doing well.  No dysuria.  VSS and well appearing.   Treated pain and hydrated.  Stone study, labs unconcerning.  Only mild leukocytosis present.  No GI symptoms to suggest enteritis.  Will have pt f/u with PCP.  Pt comfortable with plan and will follow up.         Army Chaco, MD 04/15/12 2133

## 2012-06-20 ENCOUNTER — Ambulatory Visit (INDEPENDENT_AMBULATORY_CARE_PROVIDER_SITE_OTHER): Payer: BC Managed Care – PPO | Admitting: Family Medicine

## 2012-06-20 ENCOUNTER — Ambulatory Visit: Payer: BC Managed Care – PPO

## 2012-06-20 VITALS — BP 122/78 | HR 91 | Temp 98.9°F | Resp 17 | Ht 70.0 in | Wt 156.0 lb

## 2012-06-20 DIAGNOSIS — T148XXA Other injury of unspecified body region, initial encounter: Secondary | ICD-10-CM

## 2012-06-20 DIAGNOSIS — M549 Dorsalgia, unspecified: Secondary | ICD-10-CM

## 2012-06-20 MED ORDER — METHOCARBAMOL 500 MG PO TABS: 500.0000 mg | ORAL_TABLET | Freq: Three times a day (TID) | ORAL | Status: DC

## 2012-06-20 MED ORDER — METHYLPREDNISOLONE 4 MG PO KIT: PACK | ORAL | Status: AC

## 2012-06-20 MED ORDER — HYDROCODONE-ACETAMINOPHEN 5-500 MG PO TABS
1.0000 | ORAL_TABLET | Freq: Three times a day (TID) | ORAL | Status: AC | PRN
Start: 2012-06-20 — End: 2012-06-30

## 2012-06-20 MED ORDER — NAPROXEN 500 MG PO TABS: 500.0000 mg | ORAL_TABLET | Freq: Two times a day (BID) | ORAL | Status: DC

## 2012-06-20 NOTE — Progress Notes (Signed)
[facility]:  Office Visit  Chief Complaint:  Chief Complaint  Patient presents with  . Back Pain    HPI: Dustin Wright is a 31 y.o. male who complains of  1 week history of back pain and right lower buttock. No numbnessor tingling, has had prior back pain. Denies loss of bowel or bladder fxn. 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 Certain ROM hurts his back. Tried tylenol and advil without relief.   Past Medical History  Diagnosis Date  . Kidney stone   . Chronic lower back pain    Past Surgical History  Procedure Date  . Hernia repair    History   Social History  . Marital Status: Married    Spouse Name: N/A    Number of Children: N/A  . Years of Education: N/A   Social History Main Topics  . Smoking status: Current Everyday Smoker -- 1.0 packs/day for 11 years    Types: Cigarettes  . Smokeless tobacco: None  . Alcohol Use: No  . Drug Use: No  . Sexually Active: None   Other Topics Concern  . None   Social History Narrative  . None   No family history on file. No Known Allergies Prior to Admission medications   Medication Sig Start Date End Date Taking? Authorizing Provider  ibuprofen (ADVIL,MOTRIN) 200 MG tablet Take 600 mg by mouth every 6 (six) hours as needed. FOR PAIN   Yes Historical Provider, MD  Multiple Vitamin (MULITIVITAMIN WITH MINERALS) TABS Take 1 tablet by mouth daily.     Yes Historical Provider, MD     ROS: The patient denies fevers, chills, night sweats, unintentional weight loss, chest pain, palpitations, wheezing, dyspnea on exertion, nausea, vomiting, abdominal pain, dysuria, hematuria, melena, numbness, weakness, or tingling.   All other systems have been reviewed and were otherwise negative with the exception of those mentioned in the HPI and as above.    PHYSICAL EXAM: Filed Vitals:   06/20/12 1712  BP: 122/78  Pulse: 91  Temp: 98.9 F (37.2 C)  Resp: 17    Filed Vitals:   06/20/12 1712  Height: 5\' 10"  (1.778 m)  Weight: 156 lb (70.761 kg)   Body mass index is 22.38 kg/(m^2).  General: Alert, no acute distress HEENT:  Normocephalic, atraumatic, oropharynx patent.  Cardiovascular:  Regular rate and rhythm, no rubs murmurs or gallops.  No Carotid bruits, radial pulse intact. No pedal edema.  Respiratory: Clear to auscultation bilaterally.  No wheezes, rales, or rhonchi.  No cyanosis, no use of accessory musculature GI: No organomegaly, abdomen is soft and non-tender, positive bowel sounds.  No masses. Skin: No rashes. Neurologic: Facial musculature symmetric. Psychiatric: Patient is appropriate throughout our interaction. Lymphatic: No cervical lymphadenopathy Musculoskeletal: Gait intact. + right straight leg, + tender right buttock, right lower back. Decrease ROM in flexion, SB; 5/5 strength, DTR and sensation itnact   LABS: Results for orders placed during the hospital encounter of 04/15/12  URINALYSIS, ROUTINE W REFLEX MICROSCOPIC      Component Value Range   Color, Urine YELLOW  YELLOW   APPearance CLEAR  CLEAR   Specific Gravity, Urine 1.016  1.005 - 1.030   pH 7.0  5.0 - 8.0   Glucose, UA 100 (*) NEGATIVE mg/dL   Hgb urine dipstick NEGATIVE  NEGATIVE   Bilirubin Urine NEGATIVE  NEGATIVE   Ketones, ur NEGATIVE  NEGATIVE mg/dL   Protein, ur NEGATIVE  NEGATIVE  mg/dL   Urobilinogen, UA 0.2  0.0 - 1.0 mg/dL   Nitrite NEGATIVE  NEGATIVE   Leukocytes, UA NEGATIVE  NEGATIVE  BASIC METABOLIC PANEL      Component Value Range   Sodium 140  135 - 145 mEq/L   Potassium 3.4 (*) 3.5 - 5.1 mEq/L   Chloride 102  96 - 112 mEq/L   CO2 26  19 - 32 mEq/L   Glucose, Bld 94  70 - 99 mg/dL   BUN 10  6 - 23 mg/dL   Creatinine, Ser 1.19  0.50 - 1.35 mg/dL   Calcium 14.7  8.4 - 82.9 mg/dL   GFR calc non Af Amer >90  >90 mL/min   GFR calc Af Amer >90  >90 mL/min  CBC      Component Value Range   WBC 10.7 (*) 4.0 - 10.5 K/uL   RBC 5.24   4.22 - 5.81 MIL/uL   Hemoglobin 16.1  13.0 - 17.0 g/dL   HCT 56.2  13.0 - 86.5 %   MCV 84.9  78.0 - 100.0 fL   MCH 30.7  26.0 - 34.0 pg   MCHC 36.2 (*) 30.0 - 36.0 g/dL   RDW 78.4  69.6 - 29.5 %   Platelets 296  150 - 400 K/uL  DIFFERENTIAL      Component Value Range   Neutrophils Relative 67  43 - 77 %   Neutro Abs 7.2  1.7 - 7.7 K/uL   Lymphocytes Relative 24  12 - 46 %   Lymphs Abs 2.6  0.7 - 4.0 K/uL   Monocytes Relative 8  3 - 12 %   Monocytes Absolute 0.8  0.1 - 1.0 K/uL   Eosinophils Relative 1  0 - 5 %   Eosinophils Absolute 0.1  0.0 - 0.7 K/uL   Basophils Relative 0  0 - 1 %   Basophils Absolute 0.0  0.0 - 0.1 K/uL     EKG/XRAY:   Primary read interpreted by Dr. Conley Rolls at [facility]. NL L-spine   ASSESSMENT/PLAN: Encounter Diagnoses  Name Primary?  . Back pain Yes  . Sprain and strain    Rx Naproxen, Robaxin, Medrol dose pack, Vicodin prn ROM exercises F/u 2-4 weeks prn Work note written for 06/21/12, resume work on 06/24/12.    Rockne Coons, DO 06/20/2012 6:39 PM

## 2012-07-01 ENCOUNTER — Telehealth: Payer: Self-pay | Admitting: Radiology

## 2012-07-01 DIAGNOSIS — M549 Dorsalgia, unspecified: Secondary | ICD-10-CM

## 2012-07-01 DIAGNOSIS — T148XXA Other injury of unspecified body region, initial encounter: Secondary | ICD-10-CM

## 2012-07-01 MED ORDER — METHOCARBAMOL 500 MG PO TABS: 500.0000 mg | ORAL_TABLET | Freq: Three times a day (TID) | ORAL | Status: AC

## 2012-07-01 NOTE — Telephone Encounter (Signed)
rx sent in patient aware needs office visits for more of this medication

## 2012-07-08 ENCOUNTER — Ambulatory Visit (INDEPENDENT_AMBULATORY_CARE_PROVIDER_SITE_OTHER): Payer: BC Managed Care – PPO | Admitting: Physician Assistant

## 2012-07-08 VITALS — BP 138/96 | HR 108 | Temp 98.5°F | Resp 14 | Ht 70.5 in | Wt 153.2 lb

## 2012-07-08 DIAGNOSIS — M62838 Other muscle spasm: Secondary | ICD-10-CM

## 2012-07-08 DIAGNOSIS — M549 Dorsalgia, unspecified: Secondary | ICD-10-CM

## 2012-07-08 DIAGNOSIS — L237 Allergic contact dermatitis due to plants, except food: Secondary | ICD-10-CM

## 2012-07-08 DIAGNOSIS — L255 Unspecified contact dermatitis due to plants, except food: Secondary | ICD-10-CM

## 2012-07-08 MED ORDER — CYCLOBENZAPRINE HCL 10 MG PO TABS: 10.0000 mg | ORAL_TABLET | Freq: Three times a day (TID) | ORAL | Status: DC | PRN

## 2012-07-08 MED ORDER — HYDROCODONE-ACETAMINOPHEN 5-500 MG PO TABS: 1.0000 | ORAL_TABLET | Freq: Three times a day (TID) | ORAL | Status: DC | PRN

## 2012-07-08 MED ORDER — MELOXICAM 15 MG PO TABS: 15.0000 mg | ORAL_TABLET | Freq: Every day | ORAL | Status: DC

## 2012-07-08 MED ORDER — TRIAMCINOLONE ACETONIDE 0.1 % EX CREA: TOPICAL_CREAM | Freq: Two times a day (BID) | CUTANEOUS | Status: DC

## 2012-07-08 NOTE — Progress Notes (Signed)
  Subjective:    Patient ID: Dustin Wright, male    DOB: 1981/05/16, 31 y.o.   MRN: [id number]  HPI 30 year old male presents with recurrence of back pain. Was seen 06/20/12 and given Medrol dose pack, Robaxin, and Lortab which helped some but pain has now returned.  He says that he likely went back to work too soon and re-injured it.  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 Denies any specific injury but says it is pain that has worsened over the course of the last week.  Pain is mostly on the right side and travels down his right leg. Denies paresthesias, weakness, loss of bowel/bladder function, or saddle anesthesia.    He also complains of poison ivy on bilateral legs after working outside this weekend. Is extremely pruritic. No drainage, warmth, or tenderness.    Review of Systems  All other systems reviewed and are negative.       Objective:   Physical Exam  Constitutional: He is oriented to person, place, and time. He appears well-developed and well-nourished.  HENT:  Head: Normocephalic and atraumatic.  Right Ear: External ear normal.  Left Ear: External ear normal.  Eyes: Conjunctivae are normal.  Neck: Normal range of motion.  Cardiovascular: Normal rate, regular rhythm and normal heart sounds.   Pulmonary/Chest: Effort normal and breath sounds normal.  Musculoskeletal:       Lumbar back: He exhibits decreased range of motion (due to pain), tenderness (right paraspinal muscles) and spasm (on right). He exhibits no bony tenderness.  Neurological: He is alert and oriented to person, place, and time.  Psychiatric: He has a normal mood and affect. His behavior is normal. Judgment and thought content normal.          Assessment & Plan:   1. Backache  Start Mobic 15 mg daily  Take Lortab and Flexeril as needed for spasm and pain Refer to Orthopedics for further evaluation and possible PT treatment meloxicam (MOBIC) 15 MG tablet,  HYDROcodone-acetaminophen (VICODIN) 5-500 MG per tablet, cyclobenzaprine (FLEXERIL) 10 MG tablet, Ambulatory referral to Orthopedic Surgery  2. Spasm of muscle  meloxicam (MOBIC) 15 MG tablet, HYDROcodone-acetaminophen (VICODIN) 5-500 MG per tablet, cyclobenzaprine (FLEXERIL) 10 MG tablet  3. Poison ivy  Apply thin layer bid until rash clears triamcinolone cream (KENALOG) 0.1 %   O[facility] note written through 07/10/12. Ok to write out for the rest of the week if patient still having pain.

## 2012-07-18 ENCOUNTER — Telehealth: Payer: Self-pay

## 2012-07-18 DIAGNOSIS — M549 Dorsalgia, unspecified: Secondary | ICD-10-CM

## 2012-07-18 DIAGNOSIS — M62838 Other muscle spasm: Secondary | ICD-10-CM

## 2012-07-18 NOTE — Telephone Encounter (Signed)
Pt was referred to orthopaedic, but hasn't been seen yet.  Needs refills for continuing back pain until his appt with ortho.  Flexeril, Mobic, hydrocodone.  [facility]/ [address]   Pt #[phone].

## 2012-07-19 NOTE — Telephone Encounter (Signed)
Called pt to get date of appt. LMOM to CB.

## 2012-07-20 NOTE — Telephone Encounter (Signed)
Spoke with patient and he has not scheduled official appt with ortho, but has spoken with them and plans to see them not this coming week but next (due to finances).  Can we rx meds for 2 weeks until then?  Please send to [facility].

## 2012-07-21 MED ORDER — MELOXICAM 15 MG PO TABS: 15.0000 mg | ORAL_TABLET | Freq: Every day | ORAL | Status: DC

## 2012-07-21 MED ORDER — HYDROCODONE-ACETAMINOPHEN 5-500 MG PO TABS: 1.0000 | ORAL_TABLET | Freq: Three times a day (TID) | ORAL | Status: AC | PRN

## 2012-07-21 MED ORDER — CYCLOBENZAPRINE HCL 10 MG PO TABS: 10.0000 mg | ORAL_TABLET | Freq: Three times a day (TID) | ORAL | Status: AC | PRN

## 2012-07-21 NOTE — Telephone Encounter (Signed)
Rx faxed into pharmacy, patient notified.

## 2012-07-21 NOTE — Telephone Encounter (Signed)
Sent meds in.  Hydrocodone is at [facility] station.

## 2012-09-14 ENCOUNTER — Emergency Department (HOSPITAL_COMMUNITY): Payer: BC Managed Care – PPO

## 2012-09-14 ENCOUNTER — Emergency Department (HOSPITAL_COMMUNITY)
Admission: EM | Admit: 2012-09-14 | Discharge: 2012-09-14 | Disposition: A | Discharge status: Home / Self Care | Payer: BC Managed Care – PPO | Attending: Emergency Medicine | Admitting: Emergency Medicine

## 2012-09-14 ENCOUNTER — Encounter (HOSPITAL_COMMUNITY): Payer: Self-pay | Admitting: Emergency Medicine

## 2012-09-14 DIAGNOSIS — Z87442 Personal history of urinary calculi: Secondary | ICD-10-CM | POA: Insufficient documentation

## 2012-09-14 DIAGNOSIS — F17200 Nicotine dependence, unspecified, uncomplicated: Secondary | ICD-10-CM | POA: Insufficient documentation

## 2012-09-14 DIAGNOSIS — M25519 Pain in unspecified shoulder: Secondary | ICD-10-CM | POA: Insufficient documentation

## 2012-09-14 DIAGNOSIS — IMO0002 Reserved for concepts with insufficient information to code with codable children: Secondary | ICD-10-CM | POA: Insufficient documentation

## 2012-09-14 MED ORDER — HYDROCODONE-ACETAMINOPHEN 5-325 MG PO TABS: ORAL_TABLET | ORAL | Status: DC

## 2012-09-14 MED ORDER — METHOCARBAMOL 500 MG PO TABS: 1000.0000 mg | ORAL_TABLET | Freq: Four times a day (QID) | ORAL | Status: DC

## 2012-09-14 MED ORDER — IBUPROFEN 600 MG PO TABS: 600.0000 mg | ORAL_TABLET | Freq: Four times a day (QID) | ORAL | Status: DC | PRN

## 2012-09-14 NOTE — ED Provider Notes (Signed)
History     CSN: [id number]  Arrival date & time 09/14/12  2133   First MD Initiated Contact with Patient 09/14/12 2308      Chief Complaint  Patient presents with  . Arm Pain    progressed since thursday     (Consider location/radiation/quality/duration/timing/severity/associated sxs/prior treatment) HPI Comments: Patient presents with complaint of right shoulder and arm pain that began approximately 2 days ago and is gradually becoming worse. Patient has been taking ibuprofen at home without relief. Patient denies acute injury to the areas. 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 Pain is worse with movement of arm. It is better if he holds his arm still. Patient denies numbness, tingling, weakness in his right arm or hand. Course is constant. No history of shoulder problems.  Patient is a 31 y.o. male presenting with arm pain. The history is provided by the patient.  Arm Pain Associated symptoms include arthralgias. Pertinent negatives include no joint swelling, neck pain, numbness or weakness.    Past Medical History  Diagnosis Date  . Kidney stone   . Chronic lower back pain     Past Surgical History  Procedure Date  . Hernia repair     History reviewed. No pertinent family history.  History  Substance Use Topics  . Smoking status: Current Every Day Smoker -- 1.0 packs/day for 11 years    Types: Cigarettes  . Smokeless tobacco: Not on file  . Alcohol Use: No      Review of Systems  Constitutional: Positive for activity change.  HENT: Negative for neck pain.   Musculoskeletal: Positive for arthralgias. Negative for back pain, joint swelling and gait problem.  Skin: Negative for wound.  Neurological: Negative for weakness and numbness.    Allergies  Review of patient's allergies indicates no known allergies.  Home Medications   Current Outpatient Rx  Name Route Sig Dispense Refill  . HYDROCORTISONE 1 % EX CREA Topical Apply  1 application topically daily.    . IBUPROFEN 200 MG PO TABS Oral Take 600 mg by mouth every 6 (six) hours as needed. FOR PAIN    . ADULT MULTIVITAMIN W/MINERALS CH Oral Take 1 tablet by mouth daily.        BP 178/87  Pulse 94  Temp 97.9 F (36.6 C) (Oral)  Resp 16  SpO2 100%  Physical Exam  Nursing note and vitals reviewed. Constitutional: He appears well-developed and well-nourished.  HENT:  Head: Normocephalic and atraumatic.  Eyes: Conjunctivae normal are normal.  Neck: Normal range of motion. Neck supple.  Cardiovascular: Normal pulses.   Pulses:      Radial pulses are 2+ on the right side, and 2+ on the left side.  Musculoskeletal: He exhibits tenderness. He exhibits no edema.       Right shoulder: He exhibits decreased range of motion, tenderness (anterior) and pain. He exhibits no bony tenderness, no swelling, no spasm and normal pulse.       Right elbow: Normal.      Cervical back: Normal.       Right upper arm: He exhibits tenderness (between biceps and triceps proximally). He exhibits no swelling.  Neurological: He is alert. No sensory deficit.       Motor, sensation, and vascular distal to the injury is fully intact.   Skin: Skin is warm and dry.  Psychiatric: He has a normal mood and affect.    ED Course  Procedures (including critical care time)  Labs Reviewed - No data to display Dg Shoulder Right  09/14/2012  *RADIOLOGY REPORT*  Clinical Data: Right shoulder pain  RIGHT SHOULDER - 2+ VIEW  Comparison: None.  Findings: No fracture or dislocation is seen.  The joint spaces are preserved.  The visualized soft tissues are unremarkable.  Visualized right lung is clear.  IMPRESSION: No fracture or dislocation is seen.   Original Report Authenticated By: Charline Bills, M.D.      1. Shoulder pain     11:31 PM Patient seen and examined.    Vital signs reviewed and are as follows: Filed Vitals:   09/14/12 2137  BP: 178/87  Pulse: 94  Temp: 97.9 F (36.6  C)  Resp: 16   Radiologist report reviewed. No acute process.   Pt informed of results. Urged conservative management, f/u with ortho if not improved.   Patient counseled on use of narcotic pain medications. Counseled not to combine these medications with others containing tylenol. Urged not to drink alcohol, drive, or perform any other activities that requires focus while taking these medications. The patient verbalizes understanding and agrees with the plan.   MDM  Progressive shoulder pain, neg x-ray. No acute injury but patient does strenuous work. ? Tendonitis. Likely overuse injury. Conservative management rx with ortho f/u prn. RUE is neurovascularly intact.         Renne Crigler, [place] 09/16/12 1719

## 2012-09-14 NOTE — Progress Notes (Signed)
Orthopedic Tech Progress Note Patient Details:  Dustin Wright 1981-03-02 [id number]  Ortho Devices Type of Ortho Device: Sling immobilizer   Haskell Flirt 09/14/2012, 11:42 PM

## 2012-09-14 NOTE — ED Notes (Signed)
Pt reports progressive right arm and shoulder pain throbbing in nature denies injury or event

## 2012-09-23 NOTE — ED Provider Notes (Signed)
Medical screening examination/treatment/procedure(s) were performed by non-physician practitioner and as supervising physician I was immediately available for consultation/collaboration.   Dustin Wright. Oletta Lamas, MD 09/23/12 2024

## 2012-10-24 ENCOUNTER — Ambulatory Visit (INDEPENDENT_AMBULATORY_CARE_PROVIDER_SITE_OTHER): Payer: BC Managed Care – PPO | Admitting: Family Medicine

## 2012-10-24 ENCOUNTER — Telehealth: Payer: Self-pay

## 2012-10-24 VITALS — BP 166/83 | HR 105 | Temp 98.1°F | Resp 18 | Ht 70.5 in | Wt 154.0 lb

## 2012-10-24 DIAGNOSIS — G479 Sleep disorder, unspecified: Secondary | ICD-10-CM

## 2012-10-24 DIAGNOSIS — M549 Dorsalgia, unspecified: Secondary | ICD-10-CM

## 2012-10-24 DIAGNOSIS — F411 Generalized anxiety disorder: Secondary | ICD-10-CM

## 2012-10-24 DIAGNOSIS — F419 Anxiety disorder, unspecified: Secondary | ICD-10-CM

## 2012-10-24 DIAGNOSIS — M461 Sacroiliitis, not elsewhere classified: Secondary | ICD-10-CM

## 2012-10-24 DIAGNOSIS — M62838 Other muscle spasm: Secondary | ICD-10-CM

## 2012-10-24 MED ORDER — OXAPROZIN 600 MG PO TABS: ORAL_TABLET | ORAL | Status: DC

## 2012-10-24 MED ORDER — TRAMADOL HCL 50 MG PO TABS: 50.0000 mg | ORAL_TABLET | Freq: Three times a day (TID) | ORAL | Status: AC | PRN

## 2012-10-24 MED ORDER — TRAMADOL HCL 50 MG PO TABS: 50.0000 mg | ORAL_TABLET | Freq: Three times a day (TID) | ORAL | Status: DC | PRN

## 2012-10-24 MED ORDER — MELOXICAM 15 MG PO TABS: 15.0000 mg | ORAL_TABLET | Freq: Every day | ORAL | Status: DC

## 2012-10-24 MED ORDER — METHOCARBAMOL 750 MG PO TABS: 1500.0000 mg | ORAL_TABLET | Freq: Three times a day (TID) | ORAL | Status: DC

## 2012-10-24 MED ORDER — LORAZEPAM 0.5 MG PO TABS: ORAL_TABLET | ORAL | Status: DC

## 2012-10-24 NOTE — Progress Notes (Signed)
Subjective: Patient is here for couple of things. He got a long history of right low back at the area of the sacroiliac joint. He was referred to orthopedics previously, but couldn't afford ago. He works Animal nutritionist. His back is hurting terribly. It hurts laterally no the bathtub to wash his children. He is very stressed out his his other main issue. Finances are major issue. He has gotten himself out of debt he says.  The pain radiates down his left leg.  Objective: Pleasant young man no major distress very very anxious.  Abdomen soft. Tender in the right SI joint area of his low back. Straight leg raise test negative on the left, positive at about 70 on the right. Sensory grossly normal. Gait normal.  Assessment: Sacroiliac pain and strain. Low back pain. It anxiety and family and financial stress. Insomnia  Plan: Had a long discussion with him about his life. About the need for him and his wife to communicate. Urged him to consider going to a [employer] FPU course.  See medication worse. Put him on NSAIDs

## 2012-10-24 NOTE — Patient Instructions (Addendum)
Look at [employer] information: [email or web address]  Back exercises  Medications as ordered  Return if not improving

## 2012-10-24 NOTE — Telephone Encounter (Signed)
Patient was here today for back pain would like to know if we could prescribe norco instead of what we prescribed him [phone]

## 2012-10-26 NOTE — Telephone Encounter (Signed)
I would like to stick with the tramadol for him.  With the muscle relaxant it should help.

## 2012-10-27 NOTE — Telephone Encounter (Signed)
Called pt - "not accepting calls at this time".

## 2012-10-28 NOTE — Telephone Encounter (Signed)
Pt called back and I gave him the message from Dr Alwyn Ren. Pt stated that together the meds are helping some, still has pain but less w/both meds. Advised pt to RTC if Sxs persist for re-eval. Pt agreed.

## 2012-10-28 NOTE — Telephone Encounter (Signed)
I have called patient to advise, still unable to get through on numbers given.

## 2013-01-13 ENCOUNTER — Telehealth: Payer: Self-pay

## 2013-01-13 ENCOUNTER — Telehealth: Payer: Self-pay | Admitting: Radiology

## 2013-01-13 ENCOUNTER — Ambulatory Visit (INDEPENDENT_AMBULATORY_CARE_PROVIDER_SITE_OTHER): Payer: BC Managed Care – PPO | Admitting: Emergency Medicine

## 2013-01-13 VITALS — BP 156/90 | HR 75 | Temp 98.1°F | Resp 18 | Ht 70.5 in | Wt 151.0 lb

## 2013-01-13 DIAGNOSIS — S335XXA Sprain of ligaments of lumbar spine, initial encounter: Secondary | ICD-10-CM

## 2013-01-13 MED ORDER — CYCLOBENZAPRINE HCL 10 MG PO TABS: 10.0000 mg | ORAL_TABLET | Freq: Three times a day (TID) | ORAL | Status: DC | PRN

## 2013-01-13 MED ORDER — NAPROXEN SODIUM 550 MG PO TABS: 550.0000 mg | ORAL_TABLET | Freq: Two times a day (BID) | ORAL | Status: DC

## 2013-01-13 NOTE — Telephone Encounter (Signed)
Patient called back and wants hydrocodone, he states he has tried the anaprox and flexeril in the past and this did not help. Please advise.

## 2013-01-13 NOTE — Progress Notes (Signed)
[facility] [address], [place] [address] [phone]  Date:  01/13/2013   Name:  Dustin Wright   DOB:  July 30, 1981   MRN:  [id number]  PCP:  Default, Provider, MD    Chief Complaint: Back Pain and Hip Pain   History of Present Illness:  Dustin Wright is a 32 y.o. very pleasant male patient who presents with the following:  History of low back pain.  Referred to ortho and for MRI but has failed to do so as he had financial constraints.  Works on heavy equipment.  Pain in right low back and into buttock and down to knee.  No history of acute injury or unusual activity.  No improvement with OTC medication.  Patient Active Problem List  Diagnosis  . Chronic lower back pain    Past Medical History  Diagnosis Date  . Kidney stone   . Chronic lower back pain   . Arthritis     Past Surgical History  Procedure Laterality Date  . Hernia repair      History  Substance Use Topics  . Smoking status: Current Every Day Smoker -- 1.00 packs/day for 11 years    Types: Cigarettes  . Smokeless tobacco: Not on file  . Alcohol Use: No    Family History  Problem Relation Age of Onset  . Hypertension Mother     No Known Allergies  Medication list has been reviewed and updated.  Current Outpatient Prescriptions on File Prior to Visit  Medication Sig Dispense Refill  . hydrocortisone cream 1 % Apply 1 application topically daily.      Marland Kitchen ibuprofen (ADVIL,MOTRIN) 600 MG tablet Take 1 tablet (600 mg total) by mouth every 6 (six) hours as needed for pain.  20 tablet  0  . Multiple Vitamin (MULITIVITAMIN WITH MINERALS) TABS Take 1 tablet by mouth daily.        Marland Kitchen HYDROcodone-acetaminophen (NORCO/VICODIN) 5-325 MG per tablet Take 1-2 tablets every 6 hours as needed for severe pain  10 tablet  0  . LORazepam (ATIVAN) 0.5 MG tablet Take one at bedtime for rest.  May take one in daytime if need for anxiety.  30 tablet  0  . meloxicam (MOBIC) 15 MG  tablet Take 1 tablet (15 mg total) by mouth daily.  30 tablet  0  . methocarbamol (ROBAXIN) 750 MG tablet Take 2 tablets (1,500 mg total) by mouth 3 (three) times daily.  20 tablet  0  . oxaprozin (DAYPRO) 600 MG tablet One twice daily for pain and inflammation.  30 tablet  1   No current facility-administered medications on file prior to visit.    Review of Systems:  As per HPI, otherwise negative.    Physical Examination: Filed Vitals:   01/13/13 1350  BP: 156/90  Pulse: 75  Temp: 98.1 F (36.7 C)  Resp: 18   Filed Vitals:   01/13/13 1350  Height: 5' 10.5" (1.791 m)  Weight: 151 lb (68.493 kg)   Body mass index is 21.35 kg/(m^2). Ideal Body Weight: Weight in (lb) to have BMI = 25: 176.4   GEN: WDWN, NAD, Non-toxic, Alert & Oriented x 3 HEENT: Atraumatic, Normocephalic.  Ears and Nose: No external deformity. EXTR: No clubbing/cyanosis/edema NEURO: Normal gait.  PSYCH: Normally interactive. Conversant. Not depressed or anxious appearing.  Calm demeanor.  BACK:  Tender right sacral area with tenderness over sciatic notch.  Neuro intact  Assessment and Plan: Back pain Anaprox Flexeril  Ortho referral  Carmelina Dane, MD

## 2013-01-13 NOTE — Patient Instructions (Signed)
Back Pain, Adult Low back pain is very common. About 1 in 5 people have back pain.The cause of low back pain is rarely dangerous. The pain often gets better over time.About half of people with a sudden onset of back pain feel better in just 2 weeks. About 8 in 10 people feel better by 6 weeks.  CAUSES Some common causes of back pain include:  Strain of the muscles or ligaments supporting the spine.  Wear and tear (degeneration) of the spinal discs.  Arthritis.  Direct injury to the back. DIAGNOSIS Most of the time, the direct cause of low back pain is not known.However, back pain can be treated effectively even when the exact cause of the pain is unknown.Answering your caregiver's questions about your overall health and symptoms is one of the most accurate ways to make sure the cause of your pain is not dangerous. If your caregiver needs more information, he or she may order lab work or imaging tests (X-rays or MRIs).However, even if imaging tests show changes in your back, this usually does not require surgery. HOME CARE INSTRUCTIONS For many people, back pain returns.Since low back pain is rarely dangerous, it is often a condition that people can learn to manageon their own.   Remain active. It is stressful on the back to sit or stand in one place. Do not sit, drive, or stand in one place for more than 30 minutes at a time. Take short walks on level surfaces as soon as pain allows.Try to increase the length of time you walk each day.  Do not stay in bed.Resting more than 1 or 2 days can delay your recovery.  Do not avoid exercise or work.Your body is made to move.It is not dangerous to be active, even though your back may hurt.Your back will likely heal faster if you return to being active before your pain is gone.  Pay attention to your body when you bend and lift. Many people have less discomfortwhen lifting if they bend their knees, keep the load close to their bodies,and  avoid twisting. Often, the most comfortable positions are those that put less stress on your recovering back.  Find a comfortable position to sleep. Use a firm mattress and lie on your side with your knees slightly bent. If you lie on your back, put a pillow under your knees.  Only take over-the-counter or prescription medicines as directed by your caregiver. Over-the-counter medicines to reduce pain and inflammation are often the most helpful.Your caregiver may prescribe muscle relaxant drugs.These medicines help dull your pain so you can more quickly return to your normal activities and healthy exercise.  Put ice on the injured area.  Put ice in a plastic bag.  Place a towel between your skin and the bag.  Leave the ice on for 15 to 20 minutes, 3 to 4 times a day for the first 2 to 3 days. After that, ice and heat may be alternated to reduce pain and spasms.  Ask your caregiver about trying back exercises and gentle massage. This may be of some benefit.  Avoid feeling anxious or stressed.Stress increases muscle tension and can worsen back pain.It is important to recognize when you are anxious or stressed and learn ways to manage it.Exercise is a great option. SEEK MEDICAL CARE IF:  You have pain that is not relieved with rest or medicine.  You have pain that does not improve in 1 week.  You have new symptoms.  You are generally   not feeling well. SEEK IMMEDIATE MEDICAL CARE IF:   You have pain that radiates from your back into your legs.  You develop new bowel or bladder control problems.  You have unusual weakness or numbness in your arms or legs.  You develop nausea or vomiting.  You develop abdominal pain.  You feel faint. Document Released: 11/06/2005 Document Revised: 05/07/2012 Document Reviewed: 03/27/2011 [employer] Patient Information 2013 [employer].  

## 2013-01-13 NOTE — Telephone Encounter (Signed)
PATIENT CALLED ASKING TO SPEAK TO SOMEONE ABOUT HIS REFERRAL INFORMATION AND HE SAYS HE IS IN PAIN. HE HAS FELT THAT WE ARE NOT LISTENING TO HIM BECAUSE HE TOLD THE DR THAT THE MEDICATION HE WAS PRESCRIBED HAS NOT WORKED ON THE PAST WHEN HE HAS RECEIVED IT HERE IN OUR OFFICE BEST NUMBER IS: [phone].

## 2013-01-13 NOTE — Telephone Encounter (Signed)
Error

## 2013-01-14 ENCOUNTER — Telehealth: Payer: Self-pay | Admitting: Radiology

## 2013-01-14 MED ORDER — HYDROCODONE-ACETAMINOPHEN 5-325 MG PO TABS: ORAL_TABLET | ORAL | Status: DC

## 2013-01-14 NOTE — Telephone Encounter (Signed)
Message copied by Caffie Damme on Tue Jan 14, 2013  2:52 PM ------      Message from: [place], [place] S      Created: Mon Jan 13, 2013  4:44 PM       I tried and cannot send medication on him. Please call him in 20 vicodin 5/325  1 q4-6h prn.  thanks ------

## 2013-01-14 NOTE — Telephone Encounter (Signed)
Can you check on the referral to ortho, for him?

## 2013-01-14 NOTE — Telephone Encounter (Signed)
Put this in called patient, where does he want this sent? He wants called to [facility]. This is done for him.

## 2013-01-14 NOTE — Telephone Encounter (Signed)
called

## 2013-01-14 NOTE — Telephone Encounter (Signed)
Pt is checking on medication change -the referral is in process

## 2013-01-14 NOTE — Telephone Encounter (Signed)
Message copied by Caffie Damme on Tue Jan 14, 2013  2:47 PM ------      Message from: [place], [place] S      Created: Mon Jan 13, 2013  4:44 PM       I tried and cannot send medication on him. Please call him in 20 vicodin 5/325  1 q4-6h prn.  thanks ------

## 2013-02-15 ENCOUNTER — Encounter (HOSPITAL_COMMUNITY): Payer: Self-pay | Admitting: *Deleted

## 2013-02-15 ENCOUNTER — Emergency Department (HOSPITAL_COMMUNITY)
Admission: EM | Admit: 2013-02-15 | Discharge: 2013-02-15 | Disposition: A | Discharge status: Home / Self Care | Payer: BC Managed Care – PPO | Attending: Emergency Medicine | Admitting: Emergency Medicine

## 2013-02-15 DIAGNOSIS — M545 Low back pain, unspecified: Secondary | ICD-10-CM | POA: Insufficient documentation

## 2013-02-15 DIAGNOSIS — F1120 Opioid dependence, uncomplicated: Secondary | ICD-10-CM

## 2013-02-15 DIAGNOSIS — G8929 Other chronic pain: Secondary | ICD-10-CM | POA: Insufficient documentation

## 2013-02-15 DIAGNOSIS — F1110 Opioid abuse, uncomplicated: Secondary | ICD-10-CM | POA: Insufficient documentation

## 2013-02-15 DIAGNOSIS — Z87442 Personal history of urinary calculi: Secondary | ICD-10-CM | POA: Insufficient documentation

## 2013-02-15 DIAGNOSIS — F17200 Nicotine dependence, unspecified, uncomplicated: Secondary | ICD-10-CM | POA: Insufficient documentation

## 2013-02-15 DIAGNOSIS — Z8739 Personal history of other diseases of the musculoskeletal system and connective tissue: Secondary | ICD-10-CM | POA: Insufficient documentation

## 2013-02-15 MED ORDER — CLONIDINE HCL 0.1 MG PO TABS
0.1000 mg | ORAL_TABLET | Freq: Once | ORAL | Status: AC
  Administered 2013-02-15: 0.1 mg via ORAL
  Filled 2013-02-15: qty 1

## 2013-02-15 MED ORDER — PROMETHAZINE HCL 25 MG PO TABS: 25.0000 mg | ORAL_TABLET | Freq: Four times a day (QID) | ORAL | Status: DC | PRN

## 2013-02-15 MED ORDER — LOPERAMIDE HCL 2 MG PO CAPS: 2.0000 mg | ORAL_CAPSULE | Freq: Four times a day (QID) | ORAL | Status: DC | PRN

## 2013-02-15 NOTE — Discharge Instructions (Signed)
You were seen and evaluated for your concerns of narcotics abuse. Please use the resource guide below to find help with your addiction problems.     Opiate Dependence The above names are all different names used for opiates. Opiates are any medication made from the poppy plant. It is a medication which produces a calming, sleepy effect. Because achieving this effect requires more and more of this drug to get the same result, opiates become addictive. A family history of addiction or an addictive personality increases the risk. When drug use is interfering with normal living activities, it has become abuse. This includes problems with family, friends, and your job. Psychological dependence has developed when your mind tells you that the drug is needed. This emotional dependence is the craving for the "high" that some drugs cause. Emotional addicts always want this high instead of the way they are feeling when not using the drug. This is difficult to overcome. This is usually followed by physical dependence that has developed when continuing increases of drug are required to get the same feeling or "high". This is known as addiction or chemical dependency.  SIGNS OF CHEMICAL DEPENDENCY  Friends and family tell you there is a problem.  Fighting when using drugs.  Mood swings and insomnia.  Forgetfulness.  Not remembering what you do while using (blackouts).  Feeling sick from using drugs but you continue using.  Lie about use or amounts of drugs (chemicals) used.  Need chemicals to get you going.  Suffer in work International aid/development worker or school because of drug use.  Need drugs to relate to people or feel comfortable in social situations.  Use drugs to forget problems.  Difficulty with attention.  Neglecting obligations. If you answered "yes" to any or some of the above signs of chemical dependency, you may have a problem. The longer the use of drugs continues, the greater the problems will  become. Do not experiment with drugs.  SIGNS AND SYMPTOMS OF PHYSICAL DEPENDENCE  Sudden stopping of the narcotic is uncomfortable when tolerance has developed. Physical problems will develop. This is called withdrawal.  How bad the withdrawal is varies from person to person. Some of the smaller problems are:  Tremors in the hands or shakes and jitters.  A fast heart rate and rapid breathing.  An increase in temperature.  Anxiety and panic attacks with bad dreams.  Muscle aches and pains. You may have more serious problems. These can include:  Feeling sick to your stomach or throwing up.  Dehydration develops if you cannot keep fluids down.  Tremors and chills or fever with sweating and anxiety.  Hallucinations and cravings.  Body aching with restlessness and insomnia.  Seizures or convulsions. These problems can last for months. These uncomfortable feeling can cause you to use drugs again just to feel better. OTHER HEALTH RISKS OF NARCOTICS USE INCLUDE:  The increased possibility of getting AIDS, hepatitis, other infectious or sexually transmitted diseases.  Unplanned pregnancy and having a baby born addicted to narcotics. You then put your baby through painful withdrawal symptoms including: shaking, jerking, and crying in pain. Many babies die. Other babies have lifelong disabilities and learning problems. TREATMENT  Effective treatment and management of narcotic addiction requires a multi-faceted, team approach that includes:  Medications to minimize the symptoms of narcotic withdrawal.  Medications to reduce the need for continued narcotic use.  Medical management of unrelated medical problems.  Pain management.  Social services.  Psychological treatment.  Behavioral therapies. Stopping your dependence  is hard but may save your life. If you continue using drugs, the only possible outcome is loss of self respect and esteem, violence, and eventually prison or death.  To stop abuse, you must first realize you have a problem. You control your behavior. Once you realize this, commit to quitting. Addiction is a disease. You need medical help to get well. Your caregiver can counsel you or refer you for counseling. The best way to do this is to seek out an organization for help. These include Alcoholics Anonymous, Narcotics Anonymous, or the [employer] on Alcoholism and Drug Dependence. HOW TO STAY CLEAN WHEN YOU HAVE QUIT USING  Develop healthy activities.  Form friendships with those who do not use drugs.  Stay away from all drugs. Alcohol will lessen your ability to say no.  Have ready excuses available about why you cannot use. If that is difficult, stay away from people who knew you used. HOME CARE INSTRUCTIONS   Both prescription medicines and over-the-counter medicines are used as part of the treatment. It is critical to follow the recommendations of your caregiver at home. Any additional over-the-counter medications or changes in the recommended treatment plan should be discussed with your caregiver first.  It is important to keep fluids down. Juices, soda, Gatorade, or a mixture will help prevent dehydration.  Be prepared for the emotional swings of quitting.  Call your local emergency services if seizures (convulsions) occur or if you are unable to keep liquids down.  Keep a written record of medications you take and times given. Overcoming addictions takes years. Over time you will have a lessening of the craving for narcotics. Talk to your caregiver or a member of your support group if you need more help.  Addiction cannot be cured but it can be stopped. Treatment centers are listed in the yellow pages under: Cocaine, Narcotics, and Alcoholics Anonymous. Most hospitals and clinics can refer you to a specialized care center. Document Released: 09/03/2007 Document Revised: 01/29/2012 Document Reviewed: 09/03/2007 [facility] Patient Information  2013 [place], [place].    RESOURCE GUIDE  Chronic Pain Problems: Contact [facility]  [phone] Patients need to be referred by their primary care doctor.  Insufficient Money for Medicine: Contact United Way:  call "211."   No Primary Care Doctor: - Call Health Connect  [phone] - can help you locate a primary care doctor that  accepts your insurance, provides certain services, etc. - Physician Referral Service- [phone]  Agencies that provide inexpensive medical care: - [facility]  [phone] - [facility]  [phone] - [facility]  [phone] - Women's Clinic  [phone] - Planned Parenthood  [phone] [facility]  [phone]  Medicaid-accepting [facility] Providers: - [facility]- [address]  [phone], Mon-Fri 9am-7pm, Sat 9am-1pm - [facility]- [address] [place], Suite [place]  [phone] - [facility]- [address], Suite [place]  [phone] [facility]- [address]  [phone] - Renaye Rakers- [address] [place], [address], [phone]  Only accepts [place] patients after they have their name  applied to their card  Self Pay (no insurance) in [place]: - Sickle Cell Patients: Dr Willey Blade, [facility]  [address] [place], [phone] - [facility]- [address] [place]  [phone]       -  [facility] [place]- [address]       -     [facility]- see information above (Speak to [employer] if you do not have insurance)       -  [facility]- [address],  [phone]       -  [facility]- [address], [phone]       -  Dr Julio Sicks-  [address], [address], [place], [phone]       -  [facility] - [address] [phone]       -  [facility]- [address], [phone], also [address] [phone]       -    [facility]- [address] [place], [phone], 1st & 3rd Saturday        every month, 10am-1pm  [facility] [address] [place], [place] [address] [phone]  [facility] [address] [place], [place] [address] [phone]  1) Find a Doctor and Pay Out of Pocket Although you won't have to find out who is covered by your insurance plan, it is a good idea to ask around and get recommendations. You will then need to call the office and see if the doctor you have chosen will accept you as a new patient and what types of options they offer for patients who are self-pay. Some doctors offer discounts or will set up payment plans for their patients who do not have insurance, but you will need to ask so you aren't surprised when you get to your appointment.  2) Contact Your Local Health Department Not all health departments have doctors that can see patients for sick visits, but many do, so it is worth a call to see if yours does. If you don't know where your local health department is, you can check in your phone book. The CDC also has a tool to help you locate your state's health department, and many state websites also have listings of all of their local health departments.  3) Find a Walk-in Clinic If your illness is not likely to be very severe or complicated, you may want to try a walk in clinic. These are popping up all over the country in pharmacies, drugstores, and shopping centers. They're usually staffed by nurse practitioners or physician assistants that have been trained to treat common illnesses and complaints. They're usually fairly quick and inexpensive. However, if you have serious medical issues or chronic medical problems, these are probably not your best option  STD Testing - [facility] Department of [facility], STD Clinic, [address] [place], phone [phone] or [phone].  Monday - Friday, call for an appointment. [facility] Department of [employer], STD Clinic, [place] [address], [place], phone [phone] or [phone].  Monday - Friday, call for an appointment.  Abuse/Neglect: [facility] Child Abuse Hotline (470) 350-1561 [facility] Child Abuse Hotline 937-117-8806 (After Hours)  Emergency Shelter:  [facility] 430-605-6359  Maternity Homes: - Room at the [place] [facility] [phone] - [facility] ([phone]  MRSA Hotline #:   (412)007-6388  Dental Assistance If unable to pay or uninsured, contact:  [facility]. to become qualified for the adult dental clinic.  Patients with Medicaid:  [facility] [phone] [address], [phone] [address] [phone]  If unable to pay, or uninsured, contact [facility] [phone] in [place], [phone] in [facility]) to become qualified for the adult dental clinic  [facility] [address] [place], [place] [address] [phone] [email or web address]  Other Proofreader Services: - [facility]- [address] [place], [place], [place], [address], [phone], Ext. [phone], 2nd and 4th Thursday of the month at 6:30am.  10 clients each day by appointment, can sometimes see walk-in patients if someone does not show for an appointment. [facility]- [address] Ether Griffins [place], [place], [address], [phone] - [facility] [address] [place], [place], [address], [phone] - [facility]- [phone] [facility]- [phone] [facility] Health [phone]       Behavioral Health Resources in the [facility]  Intensive Outpatient Programs: [facility]      [address] [place],  [place] [phone] Both a day and evening program       [facility] Outpatient     [address]        [place], [place] [address] [phone]         ADS: [facility] [address] [place] [phone]  [facility] Mental Health ACCESS LINE: [phone] or [phone] [address] [place], [place] [address] [email or web address]   Substance Abuse Resources: - [facility] Drug [facility]  [phone] - [facility] [phone] - [facility] [place] [phone] Floydene Flock [phone] - Residential & Outpatient Substance Abuse Program  [phone]  Psychological Services: [facility]  [phone] [facility]  [phone] - [facility], [phone] [place]. [address], [place], ACCESS LINE: (443) [phone] or (769)[phone], [email or web address]  Mobile Crisis Teams:                                        Therapeutic Alternatives         Mobile Crisis Care Unit [phone]             [facility] [address] Ginette Otto [phone]                                         Interventionist [address] [place] [place] [phone]  Self-Help/Support Groups: Mental Health Assoc. of [employer] of support groups (403)183-9102 (call for more info)   Narcotics Anonymous (NA) Caring [facility] [address] [place] - 2 meetings at this location  Residential Treatment Programs:  [facility]      [address]        [place]       [phone]         [facility] [address] [place] [address] [place], [place]  [address] [phone]  [facility]  [address] [place], [place] [address] [phone] Admissions: 8am-3pm M-F  [facility]     [address]        [place], [place] [address]       [phone]         [facility] [address] [place], [place] [phone]  [facility] [address] [place], [place] [phone]  [facility] - Intensive Outpatient      [address]     [place], [place]       [phone]         [facility] ([facility]     [address] [place], [place] [phone] or [phone]  Residential Treatment Services (RTS), Medicaid [address] [place], [place] [phone]  [facility] [address]                                               [address] [place] [phone]  [facility] Resources: [phone]               General Therapy                                                Angie Fava, PhD        [address] [place], [place] [address]         [phone]   Insurance  [facility] Behavioral   [address] [place], [place] [address] [phone]  [facility] [address] [place], [place] [address] [phone] Insurance/Medicaid/sponsorship through [facility] and Families                                              [address]                                        [place], [place] [address]    Therapy/tele-psych/case         [phone]          [facility] [address] [place], [place]  [address]  Adolescent/group home/case management [phone]                                           Creola Corn PhD       General therapy       Insurance   [phone]         Dr. Lolly Mustache, [place], M-F [phone]  [facility]  [employer] [facility] Dept. [address]                 [address]         [address] [place] [address]  [address]  Cristobal Goldmann Phone:  [phone]                                   Phone:  [phone]                   Phone:  [phone]  [facility], [phone] - [facility] - [facility]- [phone]       -     [facility] in [place], [address]             [phone], [facility] Child Abuse Hotline 260-731-5578 or [phone] (After Hours)

## 2013-02-15 NOTE — ED Notes (Signed)
Pt seeking  Out PT detox center for pain meds. Plan to give Pt a list treatment centers.

## 2013-02-15 NOTE — ED Notes (Signed)
Pt states that he has chronic back pain, and feels that he has become to dependent on pain medication. States he keeps going back to the medication due to withdraw symptoms and back pain.

## 2013-02-15 NOTE — ED Provider Notes (Signed)
History     CSN: [id number]  Arrival date & time 02/15/13  2215   First MD Initiated Contact with Patient 02/15/13 2227      Chief Complaint  Patient presents with  . detox    HPI  History provided by the patient. Patient is a 32 year old male with history of previous kidney stones and chronic low back pain who presents with requests for help with narcotic pain medication abuse. Patient states that he has become more and more addicted to narcotic pain medications. He has multiple prior visits to the emergency room for his chronic pains and occasionally requires prescriptions for narcotic pain medication for this. Initially he states using this as promptly as prescribed but in between his visits to emergency room he has resorted to purchasing narcotics off of the street to help with his chronic pains. This has escalated to using 10 mg hydrocodone significantly throughout the days. Patient states that he has family in children and does not wish to put his family at risk for his addiction problems. He works and does not wish to have any inpatient treatment but would like help with his withdrawal symptoms. He would experience some general discomfort and increasing body pain as well as some diarrhea symptoms whenever he is not taking medications. Denies significant problems right now. Denies any chest pain shortness of breath. He denies any SI or HI. No other aggravating or alleviating factors. No other associated symptoms.   Past Medical History  Diagnosis Date  . Kidney stone   . Chronic lower back pain   . Arthritis     Past Surgical History  Procedure Laterality Date  . Hernia repair      Family History  Problem Relation Age of Onset  . Hypertension Mother     History  Substance Use Topics  . Smoking status: Current Every Day Smoker -- 1.00 packs/day for 11 years    Types: Cigarettes  . Smokeless tobacco: Not on file  . Alcohol Use: No      Review of Systems   Constitutional: Negative for fever and chills.  Respiratory: Negative for shortness of breath.   Cardiovascular: Negative for chest pain.  Gastrointestinal: Positive for abdominal pain. Negative for nausea.  Musculoskeletal: Positive for back pain.  All other systems reviewed and are negative.    Allergies  Review of patient's allergies indicates no known allergies.  Home Medications   Current Outpatient Rx  Name  Route  Sig  Dispense  Refill  . acetaminophen (TYLENOL) 500 MG tablet   Oral   Take 2,000 mg by mouth 4 (four) times daily as needed for pain.         Marland Kitchen HYDROcodone-acetaminophen (NORCO) 10-325 MG per tablet   Oral   Take 1 tablet by mouth as needed for pain.         . hydrocortisone cream 1 %   Topical   Apply 1 application topically every evening.          Marland Kitchen ibuprofen (ADVIL,MOTRIN) 200 MG tablet   Oral   Take 800 mg by mouth 3 (three) times daily.         . Multiple Vitamin (MULITIVITAMIN WITH MINERALS) TABS   Oral   Take 1 tablet by mouth daily.             BP 147/83  Pulse 80  Temp(Src) 98.2 F (36.8 C) (Oral)  SpO2 98%  Physical Exam  Nursing note and vitals reviewed. Constitutional: He is  oriented to person, place, and time. He appears well-developed and well-nourished. No distress.  HENT:  Head: Normocephalic.  Cardiovascular: Normal rate and regular rhythm.   No murmur heard. Pulmonary/Chest: Effort normal and breath sounds normal.  Abdominal: Soft. There is no tenderness.  Musculoskeletal: Normal range of motion.  Neurological: He is alert and oriented to person, place, and time.  Skin: Skin is warm.  Psychiatric: He has a normal mood and affect. His behavior is normal.    ED Course  Procedures     1. Narcotic addiction       MDM  10:45 PM patient seen and evaluated. Patient well-appearing in no acute distress. Is not appear in significant discomfort.   Patient does not wish to have any kind of inpatient  treatment. We'll provide resource guide outpatient treatments. Will give prescriptions for Phenergan for any nausea vomiting symptoms. Imodium for any diarrhea.     Angus Seller, PA-C 02/16/13 2211

## 2013-02-16 ENCOUNTER — Encounter (HOSPITAL_COMMUNITY): Payer: Self-pay | Admitting: *Deleted

## 2013-02-16 ENCOUNTER — Emergency Department (HOSPITAL_COMMUNITY)
Admission: EM | Admit: 2013-02-16 | Discharge: 2013-02-16 | Disposition: A | Discharge status: Home / Self Care | Payer: BC Managed Care – PPO | Attending: Emergency Medicine | Admitting: Emergency Medicine

## 2013-02-16 ENCOUNTER — Emergency Department (HOSPITAL_COMMUNITY): Payer: BC Managed Care – PPO

## 2013-02-16 DIAGNOSIS — F1110 Opioid abuse, uncomplicated: Secondary | ICD-10-CM

## 2013-02-16 DIAGNOSIS — R110 Nausea: Secondary | ICD-10-CM | POA: Insufficient documentation

## 2013-02-16 DIAGNOSIS — G8929 Other chronic pain: Secondary | ICD-10-CM | POA: Insufficient documentation

## 2013-02-16 DIAGNOSIS — N200 Calculus of kidney: Secondary | ICD-10-CM

## 2013-02-16 DIAGNOSIS — R34 Anuria and oliguria: Secondary | ICD-10-CM | POA: Insufficient documentation

## 2013-02-16 DIAGNOSIS — M129 Arthropathy, unspecified: Secondary | ICD-10-CM | POA: Insufficient documentation

## 2013-02-16 DIAGNOSIS — Z79899 Other long term (current) drug therapy: Secondary | ICD-10-CM | POA: Insufficient documentation

## 2013-02-16 DIAGNOSIS — F17200 Nicotine dependence, unspecified, uncomplicated: Secondary | ICD-10-CM | POA: Insufficient documentation

## 2013-02-16 LAB — COMPREHENSIVE METABOLIC PANEL
ALT: 25 U/L (ref 0–53)
Albumin: 4.3 g/dL (ref 3.5–5.2)
Alkaline Phosphatase: 74 U/L (ref 39–117)
BUN: 10 mg/dL (ref 6–23)
Chloride: 105 mEq/L (ref 96–112)
GFR calc Af Amer: >90 mL/min (ref >90)
Glucose, Bld: 109 mg/dL — ABNORMAL HIGH (ref 70–99)
Potassium: 3.8 mEq/L (ref 3.5–5.1)
Sodium: 141 mEq/L (ref 135–145)
Total Bilirubin: 0.4 mg/dL (ref 0.3–1.2)
Total Protein: 7.3 g/dL (ref 6.0–8.3)

## 2013-02-16 LAB — URINALYSIS, ROUTINE W REFLEX MICROSCOPIC
Bilirubin Urine: NEGATIVE
Glucose, UA: NEGATIVE mg/dL
Ketones, ur: NEGATIVE mg/dL
Protein, ur: NEGATIVE mg/dL
pH: 5.5 (ref 5.0–8.0)

## 2013-02-16 LAB — CBC WITH DIFFERENTIAL/PLATELET
Eosinophils Absolute: 0.1 10*3/uL (ref 0.0–0.7)
Hemoglobin: 15.4 g/dL (ref 13.0–17.0)
Lymphs Abs: 2.3 10*3/uL (ref 0.7–4.0)
MCH: 30.6 pg (ref 26.0–34.0)
Monocytes Relative: 6 % (ref 3–12)
Neutro Abs: 5.8 10*3/uL (ref 1.7–7.7)
Neutrophils Relative %: 66 % (ref 43–77)
Platelets: 344 10*3/uL (ref 150–400)
RBC: 5.03 MIL/uL (ref 4.22–5.81)
WBC: 8.7 10*3/uL (ref 4.0–10.5)

## 2013-02-16 MED ORDER — ONDANSETRON 4 MG PO TBDP
8.0000 mg | ORAL_TABLET | Freq: Once | ORAL | Status: AC
  Administered 2013-02-16: 8 mg via ORAL
  Filled 2013-02-16: qty 2

## 2013-02-16 MED ORDER — KETOROLAC TROMETHAMINE 60 MG/2ML IM SOLN
30.0000 mg | Freq: Once | INTRAMUSCULAR | Status: AC
  Administered 2013-02-16: 30 mg via INTRAMUSCULAR
  Filled 2013-02-16: qty 2

## 2013-02-16 NOTE — ED Notes (Signed)
Pt requesting to see dr before he leaves, dr Effie Shy to bedside

## 2013-02-16 NOTE — ED Notes (Signed)
Pt reports he is still having pain, edpa made aware.

## 2013-02-16 NOTE — ED Provider Notes (Signed)
History     CSN: [id number]  Arrival date & time 02/16/13  4098   First MD Initiated Contact with Patient 02/16/13 [phone]      Chief Complaint  Patient presents with  . Flank Pain    (Consider location/radiation/quality/duration/timing/severity/associated sxs/prior treatment) HPI Comments: This is a 32 year old male with a history of kidney stones who presents today with sudden onset right flank pain. He states it occurred suddenly when he woke up around 730 this am. It is a sharp pain that radiates from his right flank into his groin. He tried to urinate this morning and was unable to. He vomited upon arrival to the ER and is still nauseous. He presented to the ER yesterday for help with an outpatient detox for narcotic abuse. He is in 10/10 pain. No fevers, chills, diarrhea, headache.   Patient is a 32 y.o. male presenting with flank pain. The history is provided by the patient. No language interpreter was used.  Flank Pain This is a new problem. The current episode started today. The problem occurs constantly. The problem has been rapidly worsening. Associated symptoms include abdominal pain. Pertinent negatives include no chills, coughing, fever, headaches, nausea, rash or sore throat. Nothing aggravates the symptoms. He has tried nothing for the symptoms. The treatment provided no relief.    Past Medical History  Diagnosis Date  . Kidney stone   . Chronic lower back pain   . Arthritis     Past Surgical History  Procedure Laterality Date  . Hernia repair      Family History  Problem Relation Age of Onset  . Hypertension Mother     History  Substance Use Topics  . Smoking status: Current Every Day Smoker -- 1.00 packs/day for 11 years    Types: Cigarettes  . Smokeless tobacco: Not on file  . Alcohol Use: No      Review of Systems  Constitutional: Negative for fever and chills.  HENT: Negative for sore throat.   Respiratory: Negative for cough.   Gastrointestinal:  Positive for abdominal pain. Negative for nausea.  Genitourinary: Positive for flank pain and difficulty urinating.  Skin: Negative for rash.  Neurological: Negative for headaches.    Allergies  Review of patient's allergies indicates no known allergies.  Home Medications   Current Outpatient Rx  Name  Route  Sig  Dispense  Refill  . acetaminophen (TYLENOL) 500 MG tablet   Oral   Take 2,000 mg by mouth 4 (four) times daily as needed for pain.         Marland Kitchen HYDROcodone-acetaminophen (NORCO) 10-325 MG per tablet   Oral   Take 1 tablet by mouth as needed for pain.         . hydrocortisone cream 1 %   Topical   Apply 1 application topically every evening.          Marland Kitchen ibuprofen (ADVIL,MOTRIN) 200 MG tablet   Oral   Take 800 mg by mouth 3 (three) times daily.         Marland Kitchen loperamide (IMODIUM) 2 MG capsule   Oral   Take 1 capsule (2 mg total) by mouth 4 (four) times daily as needed for diarrhea or loose stools.   12 capsule   0   . Multiple Vitamin (MULITIVITAMIN WITH MINERALS) TABS   Oral   Take 1 tablet by mouth daily.           . promethazine (PHENERGAN) 25 MG tablet   Oral  Take 1 tablet (25 mg total) by mouth every 6 (six) hours as needed for nausea.   20 tablet   0     BP 175/92  Pulse 99  Temp(Src) 97.5 F (36.4 C) (Oral)  Resp 22  SpO2 99%  Physical Exam  Nursing note and vitals reviewed. Constitutional: He is oriented to person, place, and time. He appears well-developed and well-nourished. He appears distressed.  Writhing around in pain   HENT:  Head: Normocephalic and atraumatic.  Right Ear: External ear normal.  Left Ear: External ear normal.  Nose: Nose normal.  Eyes: Conjunctivae are normal.  Neck: Normal range of motion. No tracheal deviation present.  Cardiovascular: Normal rate, regular rhythm and normal heart sounds.   Pulmonary/Chest: Effort normal and breath sounds normal. No stridor.  Abdominal: Soft. Normal appearance and bowel  sounds are normal. He exhibits no distension. There is tenderness in the right lower quadrant. There is CVA tenderness (on right). There is no rigidity and no guarding.  Musculoskeletal: Normal range of motion.  Neurological: He is alert and oriented to person, place, and time.  Skin: Skin is warm and dry. He is not diaphoretic.  Psychiatric: He has a normal mood and affect. His behavior is normal.    ED Course  Procedures (including critical care time)  Labs Reviewed  CBC WITH DIFFERENTIAL - Abnormal; Notable for the following:    MCHC 36.8 (*)    All other components within normal limits  COMPREHENSIVE METABOLIC PANEL - Abnormal; Notable for the following:    Glucose, Bld 109 (*)    All other components within normal limits  URINALYSIS, ROUTINE W REFLEX MICROSCOPIC   Dg Abd 1 View  02/16/2013  *RADIOLOGY REPORT*  Clinical Data: Right flank pain  ABDOMEN - 1 VIEW  Comparison: 06/20/2012, 04/15/2012  Findings: Tiny punctate radiopaque calculus in the right kidney inferior pole.  Nonspecific punctate small pelvic calcifications, suspect venous phleboliths.  No osseous abnormality. Nonobstructive bowel gas pattern.  IMPRESSION: Right lower pole nephrolithiasis.   Original Report Authenticated By: Judie Petit. Shick, M.D.      1. Kidney stone   2. Narcotic abuse       MDM  Patient presents today for sudden onset severe pain which he states feels like obstructing kidney stones he has had in the past. A stone is visualized on xray and described as tiny by the radiologist. It is in the right lower pole and unlikely this is what is causing his pain. He presented to the ED last night for help with an outpatient detox from narcotics. He was being prescribed them by his pcp as well as getting narcotics off the street. He now demands narcotics for the pain. Toradol was given. He states he has an obstructing kidney stone because he cannot pee, however the nurse states she was able to collect plenty of urine  for the UA which is WNL other than glucose being present. Afebrile. No signs of serious acute pathology. He was referred to a urologist per his request. Patient requested to see attending because he was not happy I refused to give him narcotics. Attending saw patient and agrees with plan. Patient states he will just go to [facility]. Advised patient to take ibuprofen, drink water, and return if he develops a fever. He should follow up with his pcp.      Mora Bellman, PA-C 02/17/13 1000

## 2013-02-16 NOTE — ED Provider Notes (Signed)
Dustin Wright is a 32 y.o. male who presents for evaluation of right flank pain. He feels like his pain is typical of an obstructing kidney stone. He is surprised that this pain came on suddenly, this morning. He had been seen last night for concerns for narcotic addiction, and feeling like he was going through withdrawal. He has been getting medications, off the street. He has recently been treated by his PCP at urgent care, for chronic back pain. He has received Vicodin prescriptions from them. He states that he has urinary urgency and dribbling; feels like he cannot urinate completely. He denies fever, chills, or dizziness  Exam alert, cooperative. His face has a very concerned appearance. He ambulates easily. His gait is normal.  Nursing Notes Reviewed/ Care Coordinated Applicable Imaging Reviewed Interpretation of Laboratory Data incorporated into ED treatment  I explained to the patient that there was no indication that he had a urogenital problem of an acute nature. He continued to assert that he was worried he had a obstructing kidney stone. His last several CT scans have shown a persistent small right renal stone. I feel that it is highly unlikely that this is causing pain today. He is stable for discharge with symptomatic treatment.  I offered the patient, further assessment, with a renal ultrasound, or CT scan of the abdomen and pelvis. He declined both. He stated that if he got worse, he would go to [facility] ER, because that is where he went previously when he had an obstructing kidney stone.  Assessment: Nonspecific back pain with drug-seeking behavior. History of chronic low back pain requiring narcotic treatment from a primary care doctor. I doubt acute urologic illnesses, spinal associated back pain, or acute intra-abdominal process. He is stable for discharge.   Medical screening examination/treatment/procedure(s) were conducted as a shared visit with non-physician  practitioner(s) and myself.  I personally evaluated the patient during the encounter    Flint Melter, MD 02/17/13 [phone]

## 2013-02-16 NOTE — ED Notes (Signed)
Pt is here with right lateral side and RLQ abdominal pain that started this am. Pt has not been able to urinate this am  Hx of kidney stone

## 2013-02-17 NOTE — ED Provider Notes (Signed)
Medical screening examination/treatment/procedure(s) were performed by non-physician practitioner and as supervising physician I was immediately available for consultation/collaboration.  Derwood Kaplan, MD 02/17/13 [phone]

## 2013-02-17 NOTE — ED Provider Notes (Signed)
.    Flint Melter, MD 02/17/13 [phone]

## 2013-03-19 ENCOUNTER — Ambulatory Visit: Payer: BC Managed Care – PPO

## 2013-03-19 ENCOUNTER — Ambulatory Visit (INDEPENDENT_AMBULATORY_CARE_PROVIDER_SITE_OTHER): Payer: BC Managed Care – PPO | Admitting: Emergency Medicine

## 2013-03-19 ENCOUNTER — Telehealth: Payer: Self-pay | Admitting: Family Medicine

## 2013-03-19 ENCOUNTER — Encounter (HOSPITAL_COMMUNITY): Payer: Self-pay | Admitting: *Deleted

## 2013-03-19 ENCOUNTER — Ambulatory Visit
Admission: RE | Admit: 2013-03-19 | Discharge: 2013-03-19 | Disposition: A | Discharge status: Home / Self Care | Payer: BC Managed Care – PPO | Source: Ambulatory Visit | Attending: Emergency Medicine | Admitting: Emergency Medicine

## 2013-03-19 ENCOUNTER — Emergency Department (HOSPITAL_COMMUNITY)
Admission: EM | Admit: 2013-03-19 | Discharge: 2013-03-19 | Discharge status: Against Medical Advice | Payer: BC Managed Care – PPO | Attending: Emergency Medicine | Admitting: Emergency Medicine

## 2013-03-19 VITALS — BP 122/84 | HR 106 | Temp 98.4°F | Resp 16 | Ht 70.5 in | Wt 156.0 lb

## 2013-03-19 DIAGNOSIS — N200 Calculus of kidney: Secondary | ICD-10-CM | POA: Insufficient documentation

## 2013-03-19 DIAGNOSIS — R109 Unspecified abdominal pain: Secondary | ICD-10-CM

## 2013-03-19 DIAGNOSIS — R319 Hematuria, unspecified: Secondary | ICD-10-CM

## 2013-03-19 DIAGNOSIS — Z87442 Personal history of urinary calculi: Secondary | ICD-10-CM

## 2013-03-19 DIAGNOSIS — R300 Dysuria: Secondary | ICD-10-CM

## 2013-03-19 DIAGNOSIS — F1911 Other psychoactive substance abuse, in remission: Secondary | ICD-10-CM | POA: Insufficient documentation

## 2013-03-19 LAB — POCT URINALYSIS DIPSTICK
Bilirubin, UA: NEGATIVE
Glucose, UA: NEGATIVE
Spec Grav, UA: 1.01
pH, UA: 7

## 2013-03-19 LAB — URINALYSIS, ROUTINE W REFLEX MICROSCOPIC
Bilirubin Urine: NEGATIVE
Glucose, UA: NEGATIVE mg/dL
Nitrite: NEGATIVE
Specific Gravity, Urine: 1.023 (ref 1.005–1.030)
pH: 6.5 (ref 5.0–8.0)

## 2013-03-19 LAB — URINE MICROSCOPIC-ADD ON

## 2013-03-19 LAB — POCT UA - MICROSCOPIC ONLY
Bacteria, U Microscopic: NEGATIVE
Crystals, Ur, HPF, POC: NEGATIVE
Epithelial cells, urine per micros: NEGATIVE
Mucus, UA: NEGATIVE

## 2013-03-19 MED ORDER — TAMSULOSIN HCL 0.4 MG PO CAPS: 0.4000 mg | ORAL_CAPSULE | Freq: Every day | ORAL | Status: DC

## 2013-03-19 MED ORDER — HYDROCODONE-ACETAMINOPHEN 5-325 MG PO TABS: 1.0000 | ORAL_TABLET | ORAL | Status: DC | PRN

## 2013-03-19 NOTE — ED Notes (Signed)
Per EMS pt went to urgent care today, told had a possible kidney infection, given medication, states oxycodone is not helping with pain, was referred to urologist, pt complaining of flank pain, groin pain and hematuria. Pain 10/10. BP 132/84, HR 78 sinus. 18G lac per EMS saline lock.

## 2013-03-19 NOTE — ED Notes (Signed)
Pt up to desk stating "I'm just going to follow up with my urologist on tomorrow, yall cant do anything for me." pt informed that we would be able to medication pt for pain and give IV fluids and check labs. Pt states "thats ok I'll just go to my urologist tomorrow."

## 2013-03-19 NOTE — ED Notes (Signed)
[id number]<YN> Expected date:<BR> Expected time:<BR> Means of arrival:<BR> Comments:<BR> EMS/31 yo male with blood in urine-seen at Urgent Care today-i.e.possible kidney stone

## 2013-03-19 NOTE — Telephone Encounter (Signed)
[facility] called with call report. Given to Dr. Cleta Alberts. He spoke with patient. Notified set up urology appt  For Thursday or Friday and sent in hydrocodone 5/325 to [facility].

## 2013-03-19 NOTE — Progress Notes (Signed)
  Subjective:    Patient ID: Dustin Wright, male    DOB: 05/22/1981, 32 y.o.   MRN: [id number]  HPI Pt presents to clinic today with UTI like symptoms. Pt states he knows he has a kidney stone in his Rt kidney. Yesterday Pt stated his urine was very dark and contained blood in it. He drank a lot of water throughout the day yesterday. This morning his first void was very dark- he describes it as if it looked like "sweet tea".   Review of Systems     Objective:   Physical Exam patient is alert and cooperative he does not appear in distress. His neck is supple. His chest exam is clear to auscultation and percussion. There's no CVA tenderness genital exam reveals normal testicles no hernias felt penis is normal  Results for orders placed in visit on 03/19/13  POCT URINALYSIS DIPSTICK      Result Value Range   Color, UA other     Clarity, UA sl cloudy     Glucose, UA neg     Bilirubin, UA neg     Ketones, UA neg     Spec Grav, UA 1.010     Blood, UA large     pH, UA 7.0     Protein, UA neg     Urobilinogen, UA 0.2     Nitrite, UA neg     Leukocytes, UA Negative    POCT UA - MICROSCOPIC ONLY      Result Value Range   WBC, Ur, HPF, POC neg     RBC, urine, microscopic TNTC     Bacteria, U Microscopic neg     Mucus, UA neg     Epithelial cells, urine per micros neg     Crystals, Ur, HPF, POC neg     Casts, Ur, LPF, POC neg     Yeast, UA neg     [facility] reading (PRIMARY) by  Dr. Cleta Alberts there is questionable stone distal left ureter at the UVJ. There is a calcific density in the right kidney.        Assessment & Plan:  Patient does have hematuria with a questionable stone on the left. Will start on Flomax 0.4 one a day. schedule CT urogram  today . We'll go ahead and check a urine culture also . CT urogram showed a stone in the right ureteropelvic junction with mild hydronephrosis. There is no definite stone on the left. I did agree to give him 10 hydrocodone pills and referral is  made to [facility].

## 2013-03-19 NOTE — Patient Instructions (Addendum)

## 2013-03-20 ENCOUNTER — Encounter (HOSPITAL_COMMUNITY): Payer: Self-pay | Admitting: Emergency Medicine

## 2013-03-20 ENCOUNTER — Emergency Department (HOSPITAL_COMMUNITY)
Admission: EM | Admit: 2013-03-20 | Discharge: 2013-03-20 | Disposition: A | Discharge status: Home / Self Care | Payer: BC Managed Care – PPO | Attending: Emergency Medicine | Admitting: Emergency Medicine

## 2013-03-20 ENCOUNTER — Telehealth: Payer: Self-pay | Admitting: Radiology

## 2013-03-20 DIAGNOSIS — Z79899 Other long term (current) drug therapy: Secondary | ICD-10-CM | POA: Insufficient documentation

## 2013-03-20 DIAGNOSIS — R319 Hematuria, unspecified: Secondary | ICD-10-CM | POA: Insufficient documentation

## 2013-03-20 DIAGNOSIS — I10 Essential (primary) hypertension: Secondary | ICD-10-CM

## 2013-03-20 DIAGNOSIS — R300 Dysuria: Secondary | ICD-10-CM | POA: Insufficient documentation

## 2013-03-20 DIAGNOSIS — F17200 Nicotine dependence, unspecified, uncomplicated: Secondary | ICD-10-CM | POA: Insufficient documentation

## 2013-03-20 DIAGNOSIS — Z8739 Personal history of other diseases of the musculoskeletal system and connective tissue: Secondary | ICD-10-CM | POA: Insufficient documentation

## 2013-03-20 DIAGNOSIS — N200 Calculus of kidney: Secondary | ICD-10-CM

## 2013-03-20 DIAGNOSIS — M545 Low back pain, unspecified: Secondary | ICD-10-CM | POA: Insufficient documentation

## 2013-03-20 DIAGNOSIS — G8929 Other chronic pain: Secondary | ICD-10-CM | POA: Insufficient documentation

## 2013-03-20 DIAGNOSIS — R3915 Urgency of urination: Secondary | ICD-10-CM | POA: Insufficient documentation

## 2013-03-20 MED ORDER — KETOROLAC TROMETHAMINE 60 MG/2ML IM SOLN
60.0000 mg | Freq: Once | INTRAMUSCULAR | Status: AC
  Administered 2013-03-20: 60 mg via INTRAMUSCULAR
  Filled 2013-03-20: qty 2

## 2013-03-20 MED ORDER — HYDROMORPHONE HCL PF 1 MG/ML IJ SOLN: 1.0000 mg | Freq: Once | INTRAMUSCULAR | Status: DC

## 2013-03-20 MED ORDER — HYDROMORPHONE HCL PF 2 MG/ML IJ SOLN
2.0000 mg | Freq: Once | INTRAMUSCULAR | Status: AC
  Administered 2013-03-20: 2 mg via INTRAMUSCULAR
  Filled 2013-03-20: qty 1

## 2013-03-20 MED ORDER — NAPROXEN 375 MG PO TABS: 375.0000 mg | ORAL_TABLET | Freq: Two times a day (BID) | ORAL | Status: DC

## 2013-03-20 MED ORDER — ONDANSETRON 4 MG PO TBDP: 4.0000 mg | ORAL_TABLET | Freq: Three times a day (TID) | ORAL | Status: DC | PRN

## 2013-03-20 MED ORDER — ONDANSETRON 4 MG PO TBDP
4.0000 mg | ORAL_TABLET | Freq: Once | ORAL | Status: AC
  Administered 2013-03-20: 4 mg via ORAL
  Filled 2013-03-20: qty 1

## 2013-03-20 NOTE — ED Provider Notes (Signed)
History     CSN: [id number]  Arrival date & time 03/20/13  0321   First MD Initiated Contact with Patient 03/20/13 0327      Chief Complaint  Patient presents with  . Nephrolithiasis    (Consider location/radiation/quality/duration/timing/severity/associated sxs/prior treatment) HPI Comments: Dustin Wright is a 32 y.o. male with a history of nephrolithiasis, chronic low back pain, and narcotic abuse presents emergency department complaining of right flank pain.  Patient reports pain is similar to passed stones.  Onset of symptoms began 2 days ago with gross hematuria.  Patient contacted the nephrologist and has an appointment scheduled for tomorrow morning.  Pain began yesterday and has been gradually worsening.  Patient went this evening to [facility] emergency department, however he felt that his pain was beginning to improve and went home without being seen but after urine and CT were performed.  Patient woke up with worsened pain and decided to drive himself to [facility]  Associated symptoms include nausea.  Pain not relieved by Norco.  No other complaints at this time.  The history is provided by the patient.    Past Medical History  Diagnosis Date  . Kidney stone   . Chronic lower back pain   . Arthritis     Past Surgical History  Procedure Laterality Date  . Hernia repair      Family History  Problem Relation Age of Onset  . Hypertension Mother     History  Substance Use Topics  . Smoking status: Current Every Day Smoker -- 1.00 packs/day for 11 years    Types: Cigarettes  . Smokeless tobacco: Never Used  . Alcohol Use: No      Review of Systems  Constitutional: Negative for fever, chills, diaphoresis and fatigue.  HENT: Negative for ear pain and sinus pressure.   Eyes: Negative for visual disturbance.  Respiratory: Negative for cough.   Cardiovascular: Negative for chest pain.  Gastrointestinal: Negative for nausea, vomiting and abdominal pain.   Genitourinary: Positive for dysuria, urgency, flank pain and difficulty urinating.  Skin: Negative for color change and rash.  Neurological: Negative for dizziness and headaches.  Psychiatric/Behavioral: Negative for confusion.  All other systems reviewed and are negative.    Allergies  Review of patient's allergies indicates no known allergies.  Home Medications   Current Outpatient Rx  Name  Route  Sig  Dispense  Refill  . HYDROcodone-acetaminophen (NORCO) 10-325 MG per tablet   Oral   Take 1 tablet by mouth as needed for pain.         . hydrocortisone cream 1 %   Topical   Apply 1 application topically every evening.          . loratadine (CLARITIN) 10 MG tablet   Oral   Take 10 mg by mouth daily.         . Multiple Vitamin (MULITIVITAMIN WITH MINERALS) TABS   Oral   Take 1 tablet by mouth daily.           . tamsulosin (FLOMAX) 0.4 MG CAPS   Oral   Take 0.4 mg by mouth daily.           BP 157/106  Temp(Src) 97.9 F (36.6 C) (Oral)  Resp 20  Ht 5\' 11"  (1.803 m)  Wt 160 lb (72.576 kg)  BMI 22.33 kg/m2  SpO2 99%  Physical Exam  Nursing note and vitals reviewed. Constitutional: He is oriented to person, place, and time. He appears well-developed and  well-nourished. He appears distressed.  HENT:  Head: Normocephalic and atraumatic.  Eyes: Conjunctivae and EOM are normal.  Neck: Normal range of motion. Neck supple.  Cardiovascular: Normal rate, regular rhythm and normal heart sounds.   Pulmonary/Chest: Effort normal.  Abdominal: Soft. Normal appearance and bowel sounds are normal. He exhibits no distension, no ascites and no pulsatile midline mass. There is tenderness (CVA tenderness). There is CVA tenderness.  Musculoskeletal: Normal range of motion.  Neurological: He is alert and oriented to person, place, and time.  Skin: Skin is warm and dry. He is not diaphoretic.  Psychiatric: He has a normal mood and affect. His behavior is normal.    ED  Course  Procedures (including critical care time)  Labs Reviewed - No data to display Ct Abdomen Pelvis Wo Contrast  03/19/2013  *RADIOLOGY REPORT*  Clinical Data: Hematuria.  Abdominal discomfort.  CT ABDOMEN AND PELVIS WITHOUT CONTRAST  Technique:  Multidetector CT imaging of the abdomen and pelvis was performed following the standard protocol without intravenous contrast.  Comparison: 03/19/2013 plain film exam. Several prior CTs, most recent 04/15/2012 CT and most remote 08/05/2009.  Findings: 4.3 mm right ureteral pelvic junction stone with mild right-sided hydronephrosis.  Right renal sub centimeter low density structure possibly a cyst although too small to characterize.  Left renal hyperdense lesions most notable upper pole region are stable.  Tiny nonobstructing left renal calculi.  No extraluminal bowel inflammatory process, free fluid or free air.  Evaluation of solid abdominal viscera is limited by lack of IV contrast.  Taking this limitation into account no worrisome hepatic, splenic, pancreatic or adrenal lesion.  No calcified gallstones.  Age advanced atherosclerotic type changes as indicated by mild calcification lower abdominal aorta/common iliac arteries.  Degenerative changes right hip.  No bony destructive lesion. Degenerative changes L5-S1.  Lung bases clear.  IMPRESSION: 4.3 mm right ureteral pelvic junction stone with mild right-sided hydronephrosis.  Please see above for additional findings.  This is a call report.   Original Report Authenticated By: Lacy Duverney, M.D.    Dg Abd 1 View  03/19/2013  *RADIOLOGY REPORT*  Clinical Data: Left flank pain and history of renal calculi.  ABDOMEN - 1 VIEW  Comparison: 02/16/2013 as well as prior CT of the abdomen and pelvis at [facility] on 04/15/2012.  Findings: Previously visualized right-sided renal calculus is again seen and measures approximately 4 mm.  This likely has moved into a more central location of the right kidney  compared to the prior radiograph at which time it was more superimposed over the lower aspect of the right kidney.  Stable bilateral calcified phleboliths in the pelvis.  No abnormal calcifications are seen overlying the left kidney or expected course of the left ureter.  Bowel gas pattern is unremarkable.  IMPRESSION: 4 mm right-sided renal calculus which is likely in a more central location of the right kidney compared to the prior x-ray.  No abnormal left-sided calcifications are seen.   Original Report Authenticated By: Irish Lack, M.D.    Component     Latest Ref Rng 03/19/2013         8:04 PM  Color, Urine     YELLOW RED (A)  APPearance     CLEAR CLOUDY (A)  Specific Gravity, Urine     1.005 - 1.030 1.023  pH     5.0 - 8.0 6.5  Glucose     NEGATIVE mg/dL NEGATIVE  Hgb urine dipstick     NEGATIVE  LARGE (A)  Bilirubin Urine     NEGATIVE NEGATIVE  Ketones, ur     NEGATIVE mg/dL NEGATIVE  Protein     NEGATIVE mg/dL 161 (A)  Urobilinogen, UA     0.0 - 1.0 mg/dL 0.2  Nitrite     NEGATIVE NEGATIVE  Leukocytes, UA     NEGATIVE SMALL (A)  Glucose        Urobilinogen, UA          No diagnosis found.    MDM  Hypertension  Nephrolithiasis  Pt w hx of Kidney Stones and narcotic abuse presents to ER w 4mm stone at UVJ. Pt has scheduled apt w specialist tomorrow. Pts pain managed in the ER & he has a home prescription for norco, d/t past drug seeking behavior in past it is not felt that more narcotics need to be given at this time. Will dc w naproxen & zofran. Pt advised that if similar presentation occurs to be evaluated at [facility] hospital as the Urologist prefer this hospital to [facility] if procedure is indicated.         Jaci Carrel, [place] 03/20/13 [phone]

## 2013-03-20 NOTE — Telephone Encounter (Signed)
Pt is calling because since being seen yesterday he has been to ER 2 times and he called and made an apt with urologists for Monday I believe and  He said he is extreme pain and is peeing blood, Pt is wanting to know if he could have something for pain Pharmacy is [place] Call back number is [phone]

## 2013-03-20 NOTE — ED Notes (Signed)
Patient states started having blood in urine on Tuesday.  Patient went to urgent care yesterday.  Patient claims that the pain "is too much".   Patient states that the pain medicine did not help at all.

## 2013-03-20 NOTE — Telephone Encounter (Signed)
Dr Patsi Sears has reviewed chart, wants patient to try to pass stone this weekend and come in to the office Monday.

## 2013-03-20 NOTE — ED Provider Notes (Signed)
Medical screening examination/treatment/procedure(s) were performed by non-physician practitioner and as supervising physician I was immediately available for consultation/collaboration.  Olivia Mackie, MD 03/20/13 [phone]

## 2013-03-20 NOTE — Telephone Encounter (Signed)
Dr Vernie Ammons has spoken to Dr Cleta Alberts, and patient will be worked in today

## 2013-03-20 NOTE — Telephone Encounter (Signed)
Pt went to ER Yesterday, was not evaluated by Urology. Have called [facility], for appt. Patient will be worked in today for an appt, per triage nurse to you [employer]

## 2014-07-08 ENCOUNTER — Ambulatory Visit: Payer: BC Managed Care – PPO

## 2014-12-15 IMAGING — CR DG ABDOMEN 1V
1 series · 1 of 1 positions shown · non-contrast
Comparison: 06/20/2012, 04/15/2012

CLINICAL DATA: Right flank pain

ABDOMEN - 1 VIEW

[t abdomen supine]
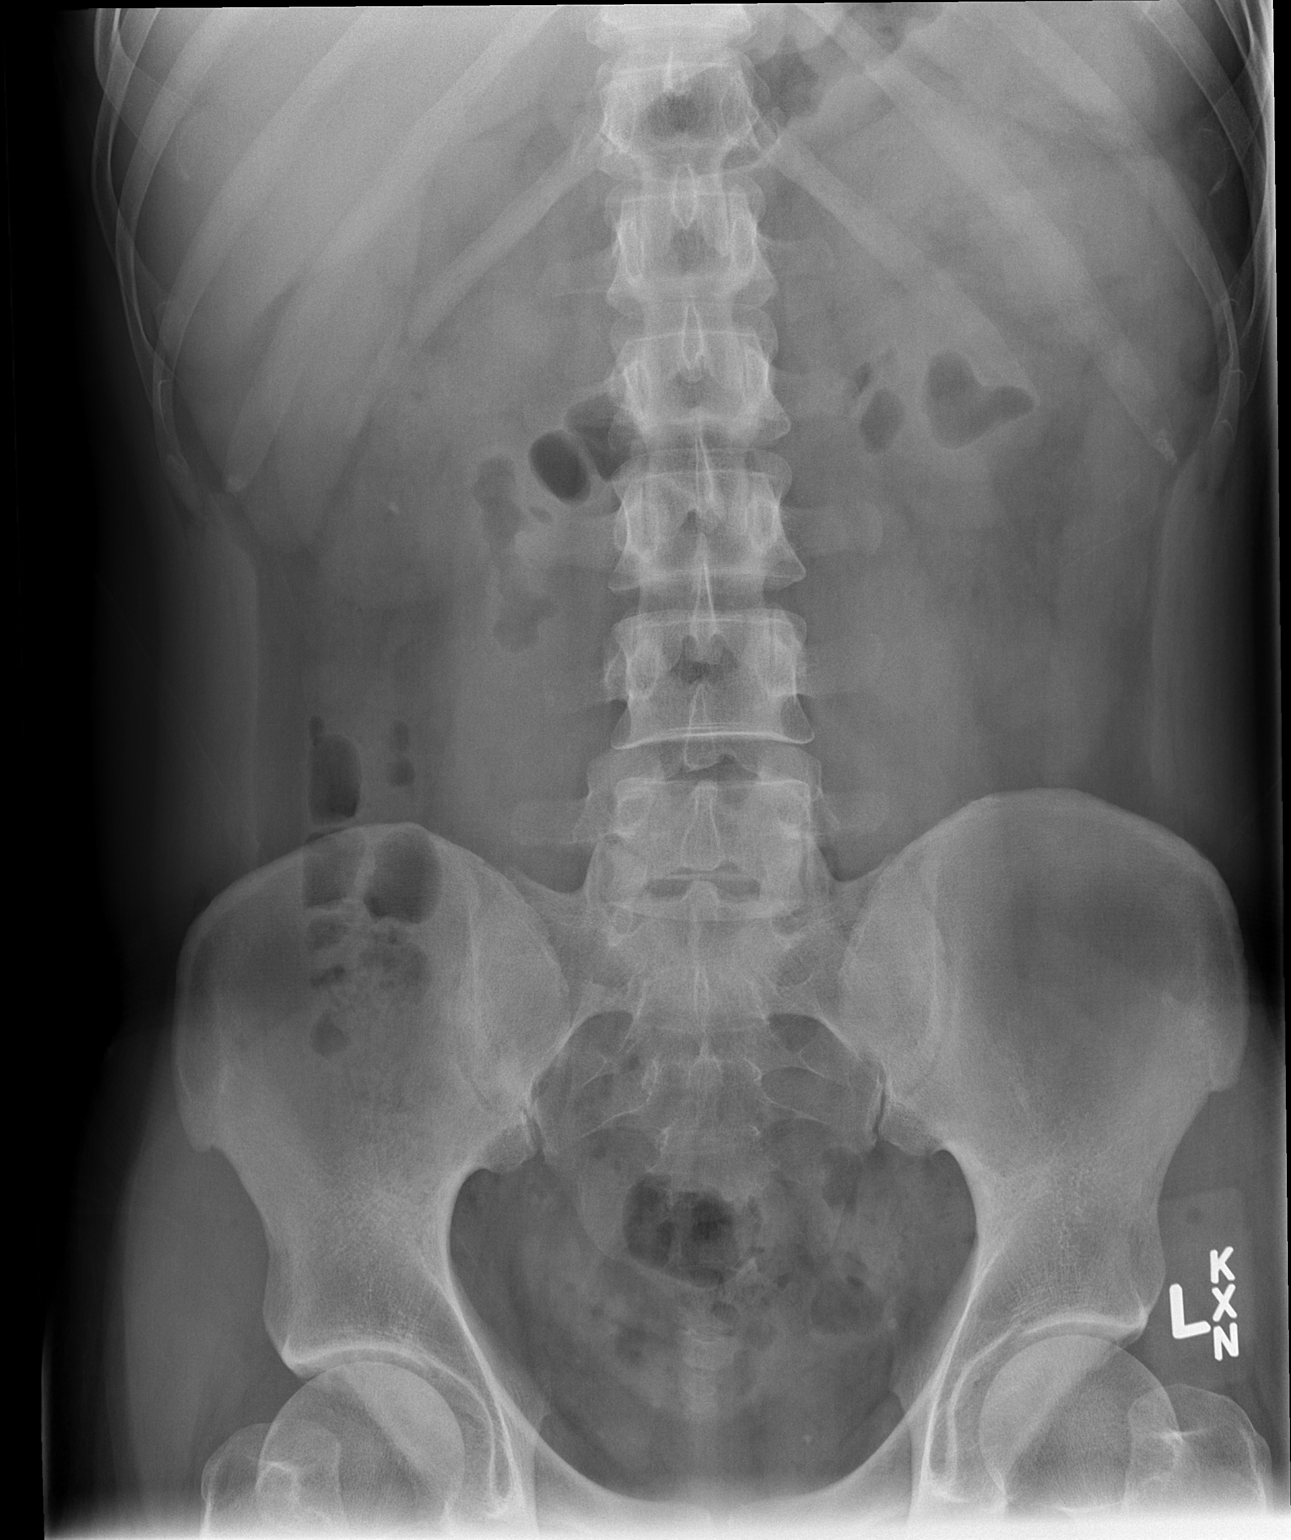

[1 of 1 positions shown; findings below may reference images not displayed]

FINDINGS: Tiny punctate radiopaque calculus in the right kidney
inferior pole.  Nonspecific punctate small pelvic calcifications,
suspect venous phleboliths.  No osseous abnormality.
Nonobstructive bowel gas pattern.
IMPRESSION: Right lower pole nephrolithiasis.

## 2015-12-22 ENCOUNTER — Emergency Department (HOSPITAL_COMMUNITY)
Admission: EM | Admit: 2015-12-22 | Discharge: 2015-12-22 | Disposition: A | Discharge status: Home / Self Care | Payer: Self-pay | Attending: Emergency Medicine | Admitting: Emergency Medicine

## 2015-12-22 ENCOUNTER — Encounter (HOSPITAL_COMMUNITY): Payer: Self-pay | Admitting: Emergency Medicine

## 2015-12-22 ENCOUNTER — Emergency Department (HOSPITAL_COMMUNITY): Payer: Self-pay

## 2015-12-22 DIAGNOSIS — G8929 Other chronic pain: Secondary | ICD-10-CM | POA: Insufficient documentation

## 2015-12-22 DIAGNOSIS — F17210 Nicotine dependence, cigarettes, uncomplicated: Secondary | ICD-10-CM | POA: Insufficient documentation

## 2015-12-22 DIAGNOSIS — N200 Calculus of kidney: Secondary | ICD-10-CM | POA: Insufficient documentation

## 2015-12-22 DIAGNOSIS — M1990 Unspecified osteoarthritis, unspecified site: Secondary | ICD-10-CM | POA: Insufficient documentation

## 2015-12-22 LAB — URINALYSIS, ROUTINE W REFLEX MICROSCOPIC
Bilirubin Urine: NEGATIVE
GLUCOSE, UA: NEGATIVE mg/dL
Ketones, ur: NEGATIVE mg/dL
LEUKOCYTES UA: NEGATIVE
Nitrite: NEGATIVE
PH: 7.5 (ref 5.0–8.0)
PROTEIN: NEGATIVE mg/dL
SPECIFIC GRAVITY, URINE: 1.01 (ref 1.005–1.030)

## 2015-12-22 LAB — CBC WITH DIFFERENTIAL/PLATELET
BASOS ABS: 0 10*3/uL (ref 0.0–0.1)
Basophils Relative: 0 %
Eosinophils Absolute: 0.1 10*3/uL (ref 0.0–0.7)
Eosinophils Relative: 1 %
HEMATOCRIT: 42.5 % (ref 39.0–52.0)
Hemoglobin: 14.9 g/dL (ref 13.0–17.0)
LYMPHS ABS: 1.1 10*3/uL (ref 0.7–4.0)
LYMPHS PCT: 11 %
MCH: 30.8 pg (ref 26.0–34.0)
MCHC: 35.1 g/dL (ref 30.0–36.0)
MCV: 88 fL (ref 78.0–100.0)
MONO ABS: 1 10*3/uL (ref 0.1–1.0)
Monocytes Relative: 10 %
NEUTROS ABS: 8 10*3/uL — AB (ref 1.7–7.7)
Neutrophils Relative %: 78 %
Platelets: 304 10*3/uL (ref 150–400)
RBC: 4.83 MIL/uL (ref 4.22–5.81)
RDW: 13 % (ref 11.5–15.5)
WBC: 10.3 10*3/uL (ref 4.0–10.5)

## 2015-12-22 LAB — URINE MICROSCOPIC-ADD ON

## 2015-12-22 LAB — BASIC METABOLIC PANEL
ANION GAP: 9 (ref 5–15)
BUN: 8 mg/dL (ref 6–20)
CO2: 26 mmol/L (ref 22–32)
Calcium: 9.7 mg/dL (ref 8.9–10.3)
Chloride: 106 mmol/L (ref 101–111)
Creatinine, Ser: 0.72 mg/dL (ref 0.61–1.24)
GFR calc Af Amer: >60 mL/min (ref >60)
GFR calc non Af Amer: >60 mL/min (ref >60)
GLUCOSE: 100 mg/dL — AB (ref 65–99)
POTASSIUM: 3.8 mmol/L (ref 3.5–5.1)
Sodium: 141 mmol/L (ref 135–145)

## 2015-12-22 MED ORDER — OXYCODONE-ACETAMINOPHEN 5-325 MG PO TABS
ORAL_TABLET | ORAL | Status: AC
  Filled 2015-12-22: qty 1

## 2015-12-22 MED ORDER — HYDROCODONE-ACETAMINOPHEN 5-325 MG PO TABS: 1.0000 | ORAL_TABLET | ORAL | Status: DC | PRN

## 2015-12-22 MED ORDER — OXYCODONE-ACETAMINOPHEN 5-325 MG PO TABS
1.0000 | ORAL_TABLET | Freq: Once | ORAL | Status: AC
  Administered 2015-12-22: 1 via ORAL

## 2015-12-22 MED ORDER — KETOROLAC TROMETHAMINE 30 MG/ML IJ SOLN
15.0000 mg | Freq: Once | INTRAMUSCULAR | Status: AC
  Administered 2015-12-22: 15 mg via INTRAVENOUS
  Filled 2015-12-22: qty 1

## 2015-12-22 MED ORDER — TAMSULOSIN HCL 0.4 MG PO CAPS: 0.4000 mg | ORAL_CAPSULE | Freq: Every day | ORAL | Status: AC

## 2015-12-22 MED ORDER — MORPHINE SULFATE (PF) 4 MG/ML IV SOLN
4.0000 mg | Freq: Once | INTRAVENOUS | Status: AC
  Administered 2015-12-22: 4 mg via INTRAVENOUS
  Filled 2015-12-22: qty 1

## 2015-12-22 NOTE — ED Provider Notes (Signed)
CSN: [id number]     Arrival date & time 12/22/15  1646 History   First MD Initiated Contact with Patient 12/22/15 1739     Chief Complaint  Patient presents with  . Flank Pain     (Consider location/radiation/quality/duration/timing/severity/associated sxs/prior Treatment) HPI   71 y m w pmh multiple kidney stones.  He comes in with sudden onset R flank pain since this morning that is sharp, intermittent, radiates to his R groin and testicle.  He says this feels similar to previous kidney stones.  He has passed all of his previous kidney stones spontaneously.  He has had no fever, dysuria, but has had dark urine today.  Past Medical History  Diagnosis Date  . Kidney stone   . Chronic lower back pain   . Arthritis    Past Surgical History  Procedure Laterality Date  . Hernia repair     Family History  Problem Relation Age of Onset  . Hypertension Mother    Social History  Substance Use Topics  . Smoking status: Current Every Day Smoker -- 1.00 packs/day for 11 years    Types: Cigarettes  . Smokeless tobacco: Never Used  . Alcohol Use: No    Review of Systems  Constitutional: Negative for fever and chills.  Eyes: Negative for redness.  Respiratory: Negative for cough and shortness of breath.   Cardiovascular: Negative for chest pain.  Gastrointestinal: Negative for nausea, vomiting, abdominal pain and diarrhea.  Genitourinary: Positive for hematuria and flank pain. Negative for dysuria.  Skin: Negative for rash.  Neurological: Negative for headaches.  All other systems reviewed and are negative.     Allergies  Review of patient's allergies indicates no known allergies.  Home Medications   Prior to Admission medications   Medication Sig Start Date End Date Taking? Authorizing Provider  acetaminophen (TYLENOL) 500 MG tablet Take 1,000 mg by mouth every 6 (six) hours as needed for mild pain.   Yes Historical Provider, MD  ibuprofen (ADVIL,MOTRIN) 200 MG tablet Take  600 mg by mouth every 6 (six) hours as needed for mild pain.   Yes Historical Provider, MD  HYDROcodone-acetaminophen (NORCO/VICODIN) 5-325 MG tablet Take 1 tablet by mouth every 4 (four) hours as needed. 12/22/15   Silas Flood, MD  tamsulosin (FLOMAX) 0.4 MG CAPS capsule Take 1 capsule (0.4 mg total) by mouth daily. 12/22/15 12/27/15  Silas Flood, MD   BP 121/78 mmHg  Pulse 66  Temp(Src) 97.5 F (36.4 C) (Oral)  Resp 21  SpO2 97% Physical Exam  Constitutional: He is oriented to person, place, and time. No distress.  HENT:  Head: Normocephalic and atraumatic.  Eyes: EOM are normal. Pupils are equal, round, and reactive to light.  Neck: Normal range of motion. Neck supple.  Cardiovascular: Normal rate.   Pulmonary/Chest: Effort normal. No respiratory distress.  Abdominal: Soft. There is no tenderness. There is no rebound and no guarding.  Genitourinary:  Testicles are non-tender with normal lie.   Musculoskeletal: Normal range of motion.  Neurological: He is alert and oriented to person, place, and time.  Skin: No rash noted. He is not diaphoretic.  Psychiatric: He has a normal mood and affect.    ED Course  Procedures (including critical care time) Labs Review Labs Reviewed  URINALYSIS, ROUTINE W REFLEX MICROSCOPIC (NOT AT [facility]) - Abnormal; Notable for the following:    Hgb urine dipstick LARGE (*)    All other components within normal limits  CBC WITH DIFFERENTIAL/PLATELET - Abnormal; Notable  for the following:    Neutro Abs 8.0 (*)    All other components within normal limits  BASIC METABOLIC PANEL - Abnormal; Notable for the following:    Glucose, Bld 100 (*)    All other components within normal limits  URINE MICROSCOPIC-ADD ON - Abnormal; Notable for the following:    Squamous Epithelial / LPF 0-5 (*)    Bacteria, UA RARE (*)    All other components within normal limits  URINE CULTURE    Imaging Review US Renal  12/22/2015  CLINICAL DATA:  Evaluate for hydronephrosis  or renal calculi EXAM: RENAL / URINARY TRACT ULTRASOUND COMPLETE COMPARISON:  03/19/2013 FINDINGS: Right Kidney: Length: 12.1 cm. Echogenicity within normal limits. No mass or hydronephrosis visualized. Left Kidney: Length: 10.7 cm. Echogenicity within normal limits. No mass or hydronephrosis visualized. Bladder: Bilateral ureteral jets visualized. The urinary bladder appears normal for the degree distention. IMPRESSION: 1. Normal renal sonogram. Electronically Signed   By: Signa Kell M.D.   On: 12/22/2015 19:14   I have personally reviewed and evaluated these images and lab results as part of my medical decision-making.   EKG Interpretation None      MDM   Final diagnoses:  Kidney stone    84 y m w pmh multiple kidney stones.  He comes in with sudden onset R flank pain since this morning that is sharp, intermittent, radiates to his R groin and testicle.  Exam unremarkable.  Concerned for kidney stone.  Will obtain cbc/bmp/ua.  Will give morphine for pain. Given multiple previous kidney stones that he has passed spontaneously, will obtain renal US to eval for hydro.  Feel no need for ct given patient says pain feels identical to previous stones and young age.  Renal US w/o hydro.  Bmp with good renal function.  ua w/ blood but no leuks.  Feel likely kidney stone, will give short course of norco/flomax and urology f/u  I have discussed the results, Dx and Tx plan with the pt. They expressed understanding and agree with the plan and were told to return to ED with any worsening of condition or concern.    Disposition: Discharge  Condition: Good  Discharge Medication List as of 12/22/2015  7:38 PM    START taking these medications   Details  HYDROcodone-acetaminophen (NORCO/VICODIN) 5-325 MG tablet Take 1 tablet by mouth every 4 (four) hours as needed., Starting 12/22/2015, Until Discontinued, Print    tamsulosin (FLOMAX) 0.4 MG CAPS capsule Take 1 capsule (0.4 mg total) by mouth  daily., Starting 12/22/2015, Until Mon 12/27/15, Print        Follow Up: Marcine Matar, MD [address] [place] [address] [phone]  In 1 week    Pt seen in conjunction with Dr. Sheppard Coil, MD 12/23/15 9147  Margarita Grizzle, MD 12/24/15 [phone]

## 2015-12-22 NOTE — Discharge Instructions (Signed)
Kidney Stones °Kidney stones (urolithiasis) are deposits that form inside your kidneys. The intense pain is caused by the stone moving through the urinary tract. When the stone moves, the ureter goes into spasm around the stone. The stone is usually passed in the urine.  °CAUSES  °· A disorder that makes certain neck glands produce too much parathyroid hormone (primary hyperparathyroidism). °· A buildup of uric acid crystals, similar to gout in your joints. °· Narrowing (stricture) of the ureter. °· A kidney obstruction present at birth (congenital obstruction). °· Previous surgery on the kidney or ureters. °· Numerous kidney infections. °SYMPTOMS  °· Feeling sick to your stomach (nauseous). °· Throwing up (vomiting). °· Blood in the urine (hematuria). °· Pain that usually spreads (radiates) to the groin. °· Frequency or urgency of urination. °DIAGNOSIS  °· Taking a history and physical exam. °· Blood or urine tests. °· CT scan. °· Occasionally, an examination of the inside of the urinary bladder (cystoscopy) is performed. °TREATMENT  °· Observation. °· Increasing your fluid intake. °· Extracorporeal shock wave lithotripsy--This is a noninvasive procedure that uses shock waves to break up kidney stones. °· Surgery may be needed if you have severe pain or persistent obstruction. There are various surgical procedures. Most of the procedures are performed with the use of small instruments. Only small incisions are needed to accommodate these instruments, so recovery time is minimized. °The size, location, and chemical composition are all important variables that will determine the proper choice of action for you. Talk to your health care provider to better understand your situation so that you will minimize the risk of injury to yourself and your kidney.  °HOME CARE INSTRUCTIONS  °· Drink enough water and fluids to keep your urine clear or pale yellow. This will help you to pass the stone or stone fragments. °· Strain  all urine through the provided strainer. Keep all particulate matter and stones for your health care provider to see. The stone causing the pain may be as small as a grain of salt. It is very important to use the strainer each and every time you pass your urine. The collection of your stone will allow your health care provider to analyze it and verify that a stone has actually passed. The stone analysis will often identify what you can do to reduce the incidence of recurrences. °· Only take over-the-counter or prescription medicines for pain, discomfort, or fever as directed by your health care provider. °· Keep all follow-up visits as told by your health care provider. This is important. °· Get follow-up X-rays if required. The absence of pain does not always mean that the stone has passed. It may have only stopped moving. If the urine remains completely obstructed, it can cause loss of kidney function or even complete destruction of the kidney. It is your responsibility to make sure X-rays and follow-ups are completed. Ultrasounds of the kidney can show blockages and the status of the kidney. Ultrasounds are not associated with any radiation and can be performed easily in a matter of minutes. °· Make changes to your daily diet as told by your health care provider. You may be told to: °¨ Limit the amount of salt that you eat. °¨ Eat 5 or more servings of fruits and vegetables each day. °¨ Limit the amount of meat, poultry, fish, and eggs that you eat. °· Collect a 24-hour urine sample as told by your health care provider. You may need to collect another urine sample every 6-12   months. °SEEK MEDICAL CARE IF: °· You experience pain that is progressive and unresponsive to any pain medicine you have been prescribed. °SEEK IMMEDIATE MEDICAL CARE IF:  °· Pain cannot be controlled with the prescribed medicine. °· You have a fever or shaking chills. °· The severity or intensity of pain increases over 18 hours and is not  relieved by pain medicine. °· You develop a new onset of abdominal pain. °· You feel faint or pass out. °· You are unable to urinate. °  °This information is not intended to replace advice given to you by your health care provider. Make sure you discuss any questions you have with your health care provider. °  °Document Released: 11/06/2005 Document Revised: 07/28/2015 Document Reviewed: 04/09/2013 °Elsevier Interactive Patient Education ©2016 [employer] ° °

## 2015-12-22 NOTE — ED Notes (Signed)
MD at bedside. 

## 2015-12-22 NOTE — ED Notes (Addendum)
Pt reports right flank pain which radiate to his groin. Pt reports hematuria this morning.Pt alert x4.

## 2015-12-23 LAB — URINE CULTURE: Culture: NO GROWTH

## 2016-02-01 ENCOUNTER — Ambulatory Visit: Payer: Self-pay | Attending: Internal Medicine

## 2016-04-04 ENCOUNTER — Encounter: Payer: Self-pay | Admitting: Family Medicine

## 2016-04-04 ENCOUNTER — Ambulatory Visit: Payer: Self-pay | Attending: Family Medicine | Admitting: Family Medicine

## 2016-04-04 ENCOUNTER — Encounter (INDEPENDENT_AMBULATORY_CARE_PROVIDER_SITE_OTHER): Payer: Self-pay

## 2016-04-04 VITALS — BP 148/78 | HR 106 | Temp 98.3°F | Resp 16 | Ht 71.0 in | Wt 164.0 lb

## 2016-04-04 DIAGNOSIS — K089 Disorder of teeth and supporting structures, unspecified: Secondary | ICD-10-CM

## 2016-04-04 DIAGNOSIS — G8929 Other chronic pain: Secondary | ICD-10-CM | POA: Insufficient documentation

## 2016-04-04 DIAGNOSIS — N201 Calculus of ureter: Secondary | ICD-10-CM

## 2016-04-04 DIAGNOSIS — L409 Psoriasis, unspecified: Secondary | ICD-10-CM

## 2016-04-04 DIAGNOSIS — Z8659 Personal history of other mental and behavioral disorders: Secondary | ICD-10-CM

## 2016-04-04 DIAGNOSIS — K0381 Cracked tooth: Secondary | ICD-10-CM | POA: Insufficient documentation

## 2016-04-04 DIAGNOSIS — Z87891 Personal history of nicotine dependence: Secondary | ICD-10-CM | POA: Insufficient documentation

## 2016-04-04 DIAGNOSIS — Z79899 Other long term (current) drug therapy: Secondary | ICD-10-CM | POA: Insufficient documentation

## 2016-04-04 LAB — POCT URINALYSIS DIPSTICK
BILIRUBIN UA: NEGATIVE
Glucose, UA: NEGATIVE
KETONES UA: NEGATIVE
Leukocytes, UA: NEGATIVE
Nitrite, UA: NEGATIVE
PH UA: 6.5
Protein, UA: NEGATIVE
Urobilinogen, UA: 0.2

## 2016-04-04 MED ORDER — TRIAMCINOLONE ACETONIDE 0.5 % EX OINT: 1.0000 "application " | TOPICAL_OINTMENT | Freq: Two times a day (BID) | CUTANEOUS | Status: DC

## 2016-04-04 MED ORDER — CHLORHEXIDINE GLUCONATE 0.12 % MT SOLN: 15.0000 mL | Freq: Two times a day (BID) | OROMUCOSAL | Status: DC

## 2016-04-04 NOTE — Progress Notes (Signed)
Establish care  Dentist referral, urology  Pain scale #4 Tobacco user 10 cigarette per day  Exercise daily  No suicidal thoughts in the past two weeks

## 2016-04-04 NOTE — Assessment & Plan Note (Signed)
Chronic pain with poor dentition  Dental referral placed peridex wash

## 2016-04-04 NOTE — Progress Notes (Signed)
Subjective:  Patient ID: Dustin Wright, male    DOB: 13-Nov-1981  Age: 35 y.o. MRN: [id number]  CC: Establish Care   HPI Dustin Wright presents for   1. Dentist referral: has cracked tooth with a lot of sensitivity. Has had extensive dental work in the past. Has chronic oral pain. No swelling or fever. He takes vicodin prn pain that he has left over from his previous dentist.  He last had a course of antibiotics on month ago.   2. Urology referral: has hx of recurrent stones. Has hx of R hydronephrosis. Last renal US on 03/19/2016 was normal. He hematuria. No flank pain currently. No nausea, emesis or fever.   3. Hx of ADHD: would like to be treated. Reports previously being on adderall.   4. Psoriasis: behind ear, groin area and with nail changes. No joint pains. Currently using OTC hydrocortisone ointment which helps with groin area.   5. Elevated BP: smokes. No ETOH. Has intermittently elevated BP and HR. No HA,CP or SOB.   Past Medical History  Diagnosis Date  . Kidney stone   . Chronic lower back pain   . Arthritis    Social History  Substance Use Topics  . Smoking status: Current Every Day Smoker -- 1.00 packs/day for 11 years    Types: Cigarettes  . Smokeless tobacco: Never Used  . Alcohol Use: No   Outpatient Prescriptions Prior to Visit  Medication Sig Dispense Refill  . HYDROcodone-acetaminophen (NORCO/VICODIN) 5-325 MG tablet Take 1 tablet by mouth every 4 (four) hours as needed. 15 tablet 0  . acetaminophen (TYLENOL) 500 MG tablet Take 1,000 mg by mouth every 6 (six) hours as needed for mild pain. Reported on 04/04/2016    . ibuprofen (ADVIL,MOTRIN) 200 MG tablet Take 600 mg by mouth every 6 (six) hours as needed for mild pain. Reported on 04/04/2016     No facility-administered medications prior to visit.    ROS Review of Systems  Constitutional: Negative for fever, chills, fatigue and unexpected weight change.  HENT: Positive for dental problem.     Eyes: Negative for visual disturbance.  Respiratory: Negative for cough and shortness of breath.   Cardiovascular: Negative for chest pain, palpitations and leg swelling.  Gastrointestinal: Negative for nausea, vomiting, abdominal pain, diarrhea, constipation and blood in stool.  Endocrine: Negative for polydipsia, polyphagia and polyuria.  Genitourinary: Positive for flank pain.  Musculoskeletal: Negative for myalgias, back pain, arthralgias, gait problem and neck pain.  Skin: Positive for rash.  Allergic/Immunologic: Negative for immunocompromised state.  Hematological: Negative for adenopathy. Does not bruise/bleed easily.  Psychiatric/Behavioral: Negative for suicidal ideas, sleep disturbance and dysphoric mood. The patient is not nervous/anxious.     Objective:  BP 148/78 mmHg  Pulse 106  Temp(Src) 98.3 F (36.8 C) (Oral)  Resp 16  Ht 5\' 11"  (1.803 m)  Wt 164 lb (74.39 kg)  BMI 22.88 kg/m2  SpO2 99%  BP/Weight 04/04/2016 12/22/2015 03/20/2013  Systolic BP 148 121 139  Diastolic BP 78 78 81  Wt. (Lbs) 164 - 160  BMI 22.88 - 22.33   Physical Exam  Constitutional: He appears well-developed and well-nourished. No distress.  HENT:  Head: Normocephalic and atraumatic.  Mouth/Throat: Abnormal dentition. Dental caries present.  Gum retraction   Neck: Normal range of motion. Neck supple.  Cardiovascular: Normal rate, regular rhythm, normal heart sounds and intact distal pulses.   Pulmonary/Chest: Effort normal and breath sounds normal.  Abdominal: There is no tenderness. There  is no CVA tenderness.  Musculoskeletal: He exhibits no edema.  Neurological: He is alert.  Skin: Skin is warm and dry. No rash noted. No erythema.     Psychiatric: He has a normal mood and affect.   UA: clear, small RBC  Assessment & Plan:   There are no diagnoses linked to this encounter. Jermale was seen today for establish care.  Diagnoses and all orders for this visit:  Chronic dental  pain -     Ambulatory referral to Dentistry -     chlorhexidine (PERIDEX) 0.12 % solution; Use as directed 15 mLs in the mouth or throat 2 (two) times daily.  Right ureteral stone -     Ambulatory referral to Urology -     POCT urinalysis dipstick  History of ADHD -     Ambulatory referral to Psychology  Psoriasis -     triamcinolone ointment (KENALOG) 0.5 %; Apply 1 application topically 2 (two) times daily.   Meds ordered this encounter  Medications  . triamcinolone ointment (KENALOG) 0.5 %    Sig: Apply 1 application topically 2 (two) times daily.    Dispense:  30 g    Refill:  0  . chlorhexidine (PERIDEX) 0.12 % solution    Sig: Use as directed 15 mLs in the mouth or throat 2 (two) times daily.    Dispense:  473 mL    Refill:  0    Follow-up: No Follow-up on file.   Dessa Phi MD

## 2016-04-04 NOTE — Assessment & Plan Note (Signed)
[facility]  Psychology referral

## 2016-04-04 NOTE — Patient Instructions (Signed)
Dustin Wright was seen today for establish care.  Diagnoses and all orders for this visit:  Chronic dental pain -     Ambulatory referral to Dentistry -     chlorhexidine (PERIDEX) 0.12 % solution; Use as directed 15 mLs in the mouth or throat 2 (two) times daily.  Right ureteral stone -     Ambulatory referral to Urology -     POCT urinalysis dipstick  History of ADHD -     Ambulatory referral to Psychology  Psoriasis -     triamcinolone ointment (KENALOG) 0.5 %; Apply 1 application topically 2 (two) times daily.    F/u in 4 weeks for BP and heart rate check  Dr. Armen PickupFunches

## 2016-04-04 NOTE — Assessment & Plan Note (Signed)
History or recurrent stones with hydronephrosis noted in 2014 that resolved as noted on 12/2015 renal US   Urology referral placed

## 2016-04-04 NOTE — Assessment & Plan Note (Signed)
Psoriasis involving scalp, groin, nails  Kenalog ointment for scalp

## 2016-04-23 ENCOUNTER — Encounter (HOSPITAL_COMMUNITY): Payer: Self-pay | Admitting: Emergency Medicine

## 2016-04-23 ENCOUNTER — Emergency Department (HOSPITAL_COMMUNITY): Payer: Self-pay

## 2016-04-23 ENCOUNTER — Emergency Department (HOSPITAL_COMMUNITY)
Admission: EM | Admit: 2016-04-23 | Discharge: 2016-04-23 | Disposition: A | Discharge status: Home / Self Care | Payer: Self-pay | Attending: Emergency Medicine | Admitting: Emergency Medicine

## 2016-04-23 DIAGNOSIS — F17210 Nicotine dependence, cigarettes, uncomplicated: Secondary | ICD-10-CM | POA: Insufficient documentation

## 2016-04-23 DIAGNOSIS — M1990 Unspecified osteoarthritis, unspecified site: Secondary | ICD-10-CM | POA: Insufficient documentation

## 2016-04-23 DIAGNOSIS — Z87442 Personal history of urinary calculi: Secondary | ICD-10-CM | POA: Insufficient documentation

## 2016-04-23 DIAGNOSIS — M25511 Pain in right shoulder: Secondary | ICD-10-CM | POA: Insufficient documentation

## 2016-04-23 DIAGNOSIS — Z8659 Personal history of other mental and behavioral disorders: Secondary | ICD-10-CM | POA: Insufficient documentation

## 2016-04-23 DIAGNOSIS — G8929 Other chronic pain: Secondary | ICD-10-CM | POA: Insufficient documentation

## 2016-04-23 DIAGNOSIS — Z79899 Other long term (current) drug therapy: Secondary | ICD-10-CM | POA: Insufficient documentation

## 2016-04-23 DIAGNOSIS — Z7952 Long term (current) use of systemic steroids: Secondary | ICD-10-CM | POA: Insufficient documentation

## 2016-04-23 MED ORDER — TRAMADOL HCL 50 MG PO TABS: 50.0000 mg | ORAL_TABLET | Freq: Four times a day (QID) | ORAL | Status: DC | PRN

## 2016-04-23 MED ORDER — IBUPROFEN 400 MG PO TABS
600.0000 mg | ORAL_TABLET | Freq: Once | ORAL | Status: AC
  Administered 2016-04-23: 600 mg via ORAL
  Filled 2016-04-23: qty 1

## 2016-04-23 NOTE — ED Notes (Signed)
Pt. Stated, I was working out yesterday with weights and my right shoulder came out of place .  It went back bu something is not right.

## 2016-04-23 NOTE — ED Notes (Signed)
States drove self to ED. 

## 2016-04-23 NOTE — ED Provider Notes (Signed)
CSN: [id number]     Arrival date & time 04/23/16  0716 History   First MD Initiated Contact with Patient 04/23/16 0740     Chief Complaint  Patient presents with  . Shoulder Pain   HPI   35 year old right-hand-dominant male presents with acute onset of right shoulder pain after lifting weights yesterday. 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 He reports that when he was working out he felt like his shoulder dislocated posteriorly (he states usually it dislocates anteriorly). He was able to reduce it himself however he has had constant pain with movement since then. Reports associated weakness. Denies acute injury or direct blow. He states he has been taking ibuprofen this morning. He reports that his job is very physical and strenuous with lifting. Denies numbness or tingling, elbow pain, or radiation of pain.  Past Medical History  Diagnosis Date  . Kidney stone   . Chronic lower back pain   . Arthritis   . ADHD (attention deficit hyperactivity disorder)     per report, no records, reports taking adderall in the past     Past Surgical History  Procedure Laterality Date  . Hernia repair     Family History  Problem Relation Age of Onset  . Hypertension Mother    Social History  Substance Use Topics  . Smoking status: Current Every Day Smoker -- 1.00 packs/day for 11 years    Types: Cigarettes  . Smokeless tobacco: Never Used  . Alcohol Use: No    Review of Systems  Musculoskeletal: Positive for myalgias and arthralgias. Negative for joint swelling.  Neurological: Negative for numbness.   Allergies  Review of patient's allergies indicates no known allergies.  Home Medications   Prior to Admission medications   Medication Sig Start Date End Date Taking? Authorizing Provider  chlorhexidine (PERIDEX) 0.12 % solution Use as directed 15 mLs in the mouth or throat 2 (two) times daily. 04/04/16   Josalyn Funches, MD   HYDROcodone-acetaminophen (NORCO/VICODIN) 5-325 MG tablet Take 1 tablet by mouth every 4 (four) hours as needed. 12/22/15   Silas Flood, MD  triamcinolone ointment (KENALOG) 0.5 % Apply 1 application topically 2 (two) times daily. 04/04/16   Josalyn Funches, MD   BP 137/82 mmHg  Pulse 67  Temp(Src) 98.2 F (36.8 C) (Oral)  Resp 18  SpO2 98%   Physical Exam  Constitutional: He is oriented to person, place, and time. He appears well-developed and well-nourished. No distress.  HENT:  Head: Normocephalic and atraumatic.  Eyes: Conjunctivae are normal. Pupils are equal, round, and reactive to light. Right eye exhibits no discharge. Left eye exhibits no discharge. No scleral icterus.  Neck: Normal range of motion.  Pulmonary/Chest: Effort normal. No respiratory distress.  Musculoskeletal:  R shoulder: No obvious swelling or deformity. Tenderness to palpation over rotator cuff insertion and biceps tendon. Decreased active ROM (90 degrees flexion and abduction). Decreased passive ROM (110 degrees flexion and 90 degrees abduction). Able to hold arm out without dropping. N/V intact.    Neurological: He is alert and oriented to person, place, and time.  Skin: Skin is warm and dry.  Psychiatric: He has a normal mood and affect.    ED Course  Procedures (including critical care time) Labs Review Labs Reviewed - No data to display  Imaging Review No results found. I have personally reviewed and evaluated these images and lab results as part of my medical decision-making.   EKG Interpretation None  MDM   Final diagnoses:  Right shoulder pain   35 year old male presents with acute on chronic right shoulder pain following a possible dislocation while working out earlier. Symptoms are consistent with rotator cuff and biceps tendinitis. X-ray negative for fracture, arthritis, dislocation. Recommend ibuprofen for pain, ice, sling. I driven given here in the ED however patient is still  complaining of significant amount of pain. Small dose of tramadol prescribed. Ortho follow-up recommended due to recurrent dislocations. Patient is NAD, non-toxic, with stable VS. Patient is informed of clinical course, understands medical decision making process, and agrees with plan. Opportunity for questions provided and all questions answered. Return precautions given.     Bethel BornKelly Marie Kaylea Mounsey, PA-C 04/23/16 [id number]  Arby BarretteMarcy Pfeiffer, MD 04/24/16 [phone]

## 2016-04-23 NOTE — ED Notes (Signed)
Pt asking for pain med - states "Advil or something". Sherlie BanK Gekas, PA, aware.

## 2016-07-13 ENCOUNTER — Ambulatory Visit (HOSPITAL_COMMUNITY): Payer: Self-pay | Admitting: Psychiatry

## 2016-08-11 ENCOUNTER — Ambulatory Visit: Payer: Self-pay | Attending: Family Medicine

## 2016-11-10 ENCOUNTER — Ambulatory Visit: Payer: Self-pay

## 2016-12-22 ENCOUNTER — Ambulatory Visit: Payer: Self-pay

## 2017-01-17 ENCOUNTER — Ambulatory Visit: Payer: Self-pay

## 2017-04-12 ENCOUNTER — Ambulatory Visit: Payer: Self-pay | Attending: Internal Medicine | Admitting: Internal Medicine

## 2017-04-12 ENCOUNTER — Encounter: Payer: Self-pay | Admitting: Internal Medicine

## 2017-04-12 VITALS — BP 126/71 | HR 76 | Temp 98.2°F | Resp 16 | Ht 71.0 in | Wt 153.0 lb

## 2017-04-12 DIAGNOSIS — H6121 Impacted cerumen, right ear: Secondary | ICD-10-CM | POA: Insufficient documentation

## 2017-04-12 DIAGNOSIS — Z87442 Personal history of urinary calculi: Secondary | ICD-10-CM | POA: Insufficient documentation

## 2017-04-12 DIAGNOSIS — F17210 Nicotine dependence, cigarettes, uncomplicated: Secondary | ICD-10-CM | POA: Insufficient documentation

## 2017-04-12 DIAGNOSIS — N200 Calculus of kidney: Secondary | ICD-10-CM

## 2017-04-12 DIAGNOSIS — F909 Attention-deficit hyperactivity disorder, unspecified type: Secondary | ICD-10-CM | POA: Insufficient documentation

## 2017-04-12 DIAGNOSIS — F17200 Nicotine dependence, unspecified, uncomplicated: Secondary | ICD-10-CM | POA: Insufficient documentation

## 2017-04-12 DIAGNOSIS — Z0000 Encounter for general adult medical examination without abnormal findings: Secondary | ICD-10-CM | POA: Insufficient documentation

## 2017-04-12 DIAGNOSIS — K029 Dental caries, unspecified: Secondary | ICD-10-CM | POA: Insufficient documentation

## 2017-04-12 DIAGNOSIS — F411 Generalized anxiety disorder: Secondary | ICD-10-CM | POA: Insufficient documentation

## 2017-04-12 DIAGNOSIS — Z23 Encounter for immunization: Secondary | ICD-10-CM

## 2017-04-12 DIAGNOSIS — L409 Psoriasis, unspecified: Secondary | ICD-10-CM | POA: Insufficient documentation

## 2017-04-12 MED ORDER — NICOTINE 7 MG/24HR TD PT24: 7.0000 mg | MEDICATED_PATCH | Freq: Every day | TRANSDERMAL | 0 refills | Status: DC

## 2017-04-12 MED ORDER — TRIAMCINOLONE ACETONIDE 0.5 % EX OINT: 1.0000 "application " | TOPICAL_OINTMENT | Freq: Two times a day (BID) | CUTANEOUS | 2 refills | Status: DC

## 2017-04-12 MED ORDER — NICOTINE 14 MG/24HR TD PT24: 14.0000 mg | MEDICATED_PATCH | Freq: Every day | TRANSDERMAL | 0 refills | Status: DC

## 2017-04-12 MED ORDER — NICOTINE 21 MG/24HR TD PT24: 21.0000 mg | MEDICATED_PATCH | Freq: Every day | TRANSDERMAL | 0 refills | Status: DC

## 2017-04-12 MED ORDER — CARBAMIDE PEROXIDE 6.5 % OT SOLN: 5.0000 [drp] | Freq: Two times a day (BID) | OTIC | 0 refills | Status: DC

## 2017-04-12 NOTE — Patient Instructions (Addendum)
Needs to see Dustin Wright for [facility] card.  Use sun screen when exposed to sun.  I have referred you to dentist and dermatologist. Steps to Quit Smoking Smoking tobacco can be bad for your health. It can also affect almost every organ in your body. Smoking puts you and people around you at risk for many serious long-lasting (chronic) diseases. Quitting smoking is hard, but it is one of the best things that you can do for your health. It is never too late to quit. What are the benefits of quitting smoking? When you quit smoking, you lower your risk for getting serious diseases and conditions. They can include:  Lung cancer or lung disease.  Heart disease.  Stroke.  Heart attack.  Not being able to have children (infertility).  Weak bones (osteoporosis) and broken bones (fractures). If you have coughing, wheezing, and shortness of breath, those symptoms may get better when you quit. You may also get sick less often. If you are pregnant, quitting smoking can help to lower your chances of having a baby of low birth weight. What can I do to help me quit smoking? Talk with your doctor about what can help you quit smoking. Some things you can do (strategies) include:  Quitting smoking totally, instead of slowly cutting back how much you smoke over a period of time.  Going to in-person counseling. You are more likely to quit if you go to many counseling sessions.  Using resources and support systems, such as:  Online chats with a Veterinary surgeon.  Phone quitlines.  Printed Materials engineer.  Support groups or group counseling.  Text messaging programs.  Mobile phone apps or applications.  Taking medicines. Some of these medicines may have nicotine in them. If you are pregnant or breastfeeding, do not take any medicines to quit smoking unless your doctor says it is okay. Talk with your doctor about counseling or other things that can help you. Talk with your doctor about using more than  one strategy at the same time, such as taking medicines while you are also going to in-person counseling. This can help make quitting easier. What things can I do to make it easier to quit? Quitting smoking might feel very hard at first, but there is a lot that you can do to make it easier. Take these steps:  Talk to your family and friends. Ask them to support and encourage you.  Call phone quitlines, reach out to support groups, or work with a Veterinary surgeon.  Ask people who smoke to not smoke around you.  Avoid places that make you want (trigger) to smoke, such as:  Bars.  Parties.  Smoke-break areas at work.  Spend time with people who do not smoke.  Lower the stress in your life. Stress can make you want to smoke. Try these things to help your stress:  Getting regular exercise.  Deep-breathing exercises.  Yoga.  Meditating.  Doing a body scan. To do this, close your eyes, focus on one area of your body at a time from head to toe, and notice which parts of your body are tense. Try to relax the muscles in those areas.  Download or buy apps on your mobile phone or tablet that can help you stick to your quit plan. There are many free apps, such as QuitGuide from the [employer] developer for Disease Control and Prevention). You can find more support from smokefree.gov and other websites. This information is not intended to replace advice given to you  by your health care provider. Make sure you discuss any questions you have with your health care provider. Document Released: 09/02/2009 Document Revised: 07/04/2016 Document Reviewed: 03/23/2015 Elsevier Interactive Patient Education  2017 [employer].   Tdap Vaccine (Tetanus, Diphtheria and Pertussis): What You Need to Know  1. Why get vaccinated? Tetanus, diphtheria and pertussis are very serious diseases. Tdap vaccine can protect [place] from these diseases. And, Tdap vaccine given to pregnant women can protect newborn babies against  pertussis. TETANUS (Lockjaw) is rare in the [place] today. It causes painful muscle tightening and stiffness, usually all over the body.  It can lead to tightening of muscles in the head and neck so you can't open your mouth, swallow, or sometimes even breathe. Tetanus kills about 1 out of 10 people who are infected even after receiving the best medical care. DIPHTHERIA is also rare in the [place] today. It can cause a thick coating to form in the back of the throat.  It can lead to breathing problems, heart failure, paralysis, and death. PERTUSSIS (Whooping Cough) causes severe coughing spells, which can cause difficulty breathing, vomiting and disturbed sleep.  It can also lead to weight loss, incontinence, and rib fractures. Up to 2 in 100 adolescents and 5 in 100 adults with pertussis are hospitalized or have complications, which could include pneumonia or death. These diseases are caused by bacteria. Diphtheria and pertussis are spread from person to person through secretions from coughing or sneezing. Tetanus enters the body through cuts, scratches, or wounds. Before vaccines, as many as 200,000 cases of diphtheria, 200,000 cases of pertussis, and hundreds of cases of tetanus, were reported in the [place] each year. Since vaccination began, reports of cases for tetanus and diphtheria have dropped by about 99% and for pertussis by about 80%. 2. Tdap vaccine Tdap vaccine can protect adolescents and adults from tetanus, diphtheria, and pertussis. One dose of Tdap is routinely given at age 70 or 73. People who did not get Tdap at that age should get it as soon as possible. Tdap is especially important for healthcare professionals and anyone having close contact with a baby younger than 12 months. Pregnant women should get a dose of Tdap during every pregnancy, to protect the newborn from pertussis. Infants are most at risk for severe, life-threatening complications from  pertussis. Another vaccine, called Td, protects against tetanus and diphtheria, but not pertussis. A Td booster should be given every 10 years. Tdap may be given as one of these boosters if you have never gotten Tdap before. Tdap may also be given after a severe cut or burn to prevent tetanus infection. Your doctor or the person giving you the vaccine can give you more information. Tdap may safely be given at the same time as other vaccines. 3. Some people should not get this vaccine  A person who has ever had a life-threatening allergic reaction after a previous dose of any diphtheria, tetanus or pertussis containing vaccine, OR has a severe allergy to any part of this vaccine, should not get Tdap vaccine. Tell the person giving the vaccine about any severe allergies.  Anyone who had coma or long repeated seizures within 7 days after a childhood dose of DTP or DTaP, or a previous dose of Tdap, should not get Tdap, unless a cause other than the vaccine was found. They can still get Td.  Talk to your doctor if you:  have seizures or another nervous system problem,  had severe pain  or swelling after any vaccine containing diphtheria, tetanus or pertussis,  ever had a condition called Guillain-Barr Syndrome (GBS),  aren't feeling well on the day the shot is scheduled. 4. Risks With any medicine, including vaccines, there is a chance of side effects. These are usually mild and go away on their own. Serious reactions are also possible but are rare. Most people who get Tdap vaccine do not have any problems with it. Mild problems following Tdap:  (Did not interfere with activities)  Pain where the shot was given (about 3 in 4 adolescents or 2 in 3 adults)  Redness or swelling where the shot was given (about 1 person in 5)  Mild fever of at least 100.59F (up to about 1 in 25 adolescents or 1 in 100 adults)  Headache (about 3 or 4 people in 10)  Tiredness (about 1 person in 3 or  4)  Nausea, vomiting, diarrhea, stomach ache (up to 1 in 4 adolescents or 1 in 10 adults)  Chills, sore joints (about 1 person in 10)  Body aches (about 1 person in 3 or 4)  Rash, swollen glands (uncommon) Moderate problems following Tdap:  (Interfered with activities, but did not require medical attention)  Pain where the shot was given (up to 1 in 5 or 6)  Redness or swelling where the shot was given (up to about 1 in 16 adolescents or 1 in 12 adults)  Fever over 102F (about 1 in 100 adolescents or 1 in 250 adults)  Headache (about 1 in 7 adolescents or 1 in 10 adults)  Nausea, vomiting, diarrhea, stomach ache (up to 1 or 3 people in 100)  Swelling of the entire arm where the shot was given (up to about 1 in 500). Severe problems following Tdap:  (Unable to perform usual activities; required medical attention)  Swelling, severe pain, bleeding and redness in the arm where the shot was given (rare). Problems that could happen after any vaccine:   People sometimes faint after a medical procedure, including vaccination. Sitting or lying down for about 15 minutes can help prevent fainting, and injuries caused by a fall. Tell your doctor if you feel dizzy, or have vision changes or ringing in the ears.  Some people get severe pain in the shoulder and have difficulty moving the arm where a shot was given. This happens very rarely.  Any medication can cause a severe allergic reaction. Such reactions from a vaccine are very rare, estimated at fewer than 1 in a million doses, and would happen within a few minutes to a few hours after the vaccination. As with any medicine, there is a very remote chance of a vaccine causing a serious injury or death. The safety of vaccines is always being monitored. For more information, visit: http://floyd.org/ 5. What if there is a serious problem? What should I look for?  Look for anything that concerns you, such as signs of a severe allergic  reaction, very high fever, or unusual behavior. Signs of a severe allergic reaction can include hives, swelling of the face and throat, difficulty breathing, a fast heartbeat, dizziness, and weakness. These would usually start a few minutes to a few hours after the vaccination. What should I do?   If you think it is a severe allergic reaction or other emergency that can't wait, call 9-1-1 or get the person to the nearest hospital. Otherwise, call your doctor.  Afterward, the reaction should be reported to the Vaccine Adverse Event Reporting System (VAERS).  Your doctor might file this report, or you can do it yourself through the VAERS web site at www.vaers.LAgents.nohhs.gov, or by calling 1-(514)521-7018.  VAERS does not give medical advice. 6. The National Vaccine Injury Compensation Program The [employer] Vaccine Injury Compensation Program (VICP) is a federal program that was created to compensate people who may have been injured by certain vaccines. Persons who believe they may have been injured by a vaccine can learn about the program and about filing a claim by calling 1-269-375-2640 or visiting the VICP website at SpiritualWord.atwww.hrsa.gov/vaccinecompensation. There is a time limit to file a claim for compensation. 7. How can I learn more?  Ask your doctor. He or she can give you the vaccine package insert or suggest other sources of information.  Call your local or state health department.  Contact the Centers for Disease Control and Prevention (CDC):  Call 989-664-85201-(307)493-7493 (1-800-CDC-INFO) or  Visit CDC's website at PicCapture.uywww.cdc.gov/vaccines CDC Tdap Vaccine VIS (01/13/14) This information is not intended to replace advice given to you by your health care provider. Make sure you discuss any questions you have with your health care provider. Document Released: 05/07/2012 Document Revised: 07/27/2016 Document Reviewed: 07/27/2016 Elsevier Interactive Patient Education  2017 [employer]

## 2017-04-12 NOTE — Progress Notes (Signed)
Patient ID: Dustin Wright, male    DOB: 02-16-1981  MRN: [id number]  CC: Re-Establish Care and Annual Exam   Subjective: Dustin Wright is a 36 y.o. male who presents for physical. His concerns today include:  1.  Dental referral - wears partials.  Tooth broken off in gum  2.  Toe nails turning yellow.  Not sure if psoriasis -finger nail pitting  3. Psoriasis: "every where"  But mainly behind ears and in groin area at this time. Worse in summer and winter. Saw  Derm may yrs ago -using OTC steroid cream which helps some time  4.  Tobacco: 1 pk/day since age 36. Tried to quit several times. Gets HA and agitated. Tried patches, gum. Never tried oral meds -wants to quit  5.  ADHD: see Dr. Lafayette Dragonarr.  On Adderal 20 mg daily  6. Hx of kidney stones -wonders if he needs to see urology.  Pass stones in past.  Last CT abdomen in 2014 revealed RT UJ stone with mild hydronephrosis  Patient Active Problem List   Diagnosis Date Noted  . Dental cavity 04/12/2017  . Chronic dental pain 04/04/2016  . Right ureteral stone 04/04/2016  . History of ADHD 04/04/2016  . Psoriasis 04/04/2016     No current outpatient prescriptions on file prior to visit.   No current facility-administered medications on file prior to visit.     No Known Allergies  Social History   Social History  . Marital status: Married    Spouse name: N/A  . Number of children: N/A  . Years of education: N/A   Occupational History  . Not on file.   Social History Main Topics  . Smoking status: Current Every Day Smoker    Packs/day: 1.00    Years: 11.00    Types: Cigarettes  . Smokeless tobacco: Current User  . Alcohol use No  . Drug use: No  . Sexual activity: Yes    Partners: Female    Birth control/ protection: None   Other Topics Concern  . Not on file   Social History Narrative  . No narrative on file    Family History  Problem Relation Age of Onset  . Hypertension Mother     Past  Surgical History:  Procedure Laterality Date  . HERNIA REPAIR      ROS: Review of Systems  Constitutional: Negative for activity change, appetite change, fever and unexpected weight change.  Respiratory: Negative for cough and shortness of breath.   Cardiovascular: Negative for chest pain, palpitations and leg swelling.  Gastrointestinal: Negative for abdominal pain and anal bleeding.  Genitourinary: Negative for difficulty urinating and discharge.  Psychiatric/Behavioral: Negative for agitation.    PHYSICAL EXAM: BP 126/71 (BP Location: Right Arm, Patient Position: Sitting, Cuff Size: Large)   Pulse 76   Temp 98.2 F (36.8 C) (Oral)   Resp 16   Ht 5\' 11"  (1.803 m)   Wt 153 lb (69.4 kg)   SpO2 97%   BMI 21.34 kg/m   Physical Exam General appearance - alert, well appearing, young caucasian male and in no distress Mental status - alert, oriented to person, place, and time, normal mood, behavior, speech, dress, motor activity, and thought processes Eyes - pupils equal and reactive, extraocular eye movements intact Ears -Moderate amount of soft wax in the right ear obscuring view of the tympanic membrane Nose - normal and patent, no erythema, discharge or polyps Mouth - mucous membranes moist, pharynx normal without  lesions.  Partials in upper jaw removed.  Has decay of RT upper incisor with significant plaque build up around this tooth.  Adjacent tooth broken off in the gum Neck - supple, no significant adenopathy, no thyroid enlargement Lymphatics - no cervical, inguinal or axillary lymphadenopathy  Chest - clear to auscultation, no wheezes, rales or rhonchi, symmetric air entry Heart - normal rate, regular rhythm, normal S1, S2, no murmurs, rubs, clicks or gallops Abdomen - soft, nontender, nondistended, no masses or organomegaly GU Male - no penile lesions.  Mild erythema below scrotum Neurological - cranial nerves II through XII intact, motor and sensory grossly normal  bilaterally Musculoskeletal - no joint tenderness, deformity or swelling.   Extremities - peripheral pulses normal, no pedal edema, no clubbing or cyanosis Skin - several tatoos on arms and posterior thorax.  shiny psoriasis plaques behind both earlobes and a few at the base of the penis Nails:  + pitting of nails on feet with onycholysis  GAD 7 : Generalized Anxiety Score 04/12/2017 04/04/2016 04/04/2016  Nervous, Anxious, on Edge 0 2 2  Control/stop worrying 0 3 3  Worry too much - different things 0 3 3  Trouble relaxing 0 3 3  Restless 0 2 2  Easily annoyed or irritable 0 1 1  Afraid - awful might happen 0 0 0  Total GAD 7 Score 0 14 14  Anxiety Difficulty Not difficult at all - -   Depression screen [facility] 2/9 04/12/2017  Decreased Interest 0  Down, Depressed, Hopeless 0  PHQ - 2 Score 0  Altered sleeping 0  Tired, decreased energy 0  Change in appetite 0  Feeling bad or failure about yourself  0  Trouble concentrating 0  Moving slowly or fidgety/restless 0  Suicidal thoughts 0  PHQ-9 Score 0    ASSESSMENT AND PLAN: 1. Psoriasis -Discussed importance of keeping skin well moisturized Discussed importance of using sunscreen in the summer months - triamcinolone ointment (KENALOG) 0.5 %; Apply 1 application topically 2 (two) times daily.  Dispense: 30 g; Refill: 2 - Ambulatory referral to Dermatology  2. Dental cavity -Referred to dentist  3. Impacted cerumen of right ear -Debrox to use when necessary  4. Tobacco dependence Patient advised to quit smoking. Discussed health risks associated with smoking including lung and other types of cancers, chronic lung diseases and CV risks.. Pt ready to give trail of quitting.  Discussed methods to help quit including quitting cold [place], use of NRT, Chantix and Bupropion. He decided to try the stepdown method with NRT - nicotine (NICODERM CQ - DOSED IN MG/24 HOURS) 14 mg/24hr patch; Place 1 patch (14 mg total) onto the skin daily.   Dispense: 28 patch; Refill: 0 - nicotine (NICODERM CQ - DOSED IN MG/24 HOURS) 21 mg/24hr patch; Place 1 patch (21 mg total) onto the skin daily.  Dispense: 28 patch; Refill: 0 - nicotine (NICODERM CQ - DOSED IN MG/24 HR) 7 mg/24hr patch; Place 1 patch (7 mg total) onto the skin daily.  Dispense: 28 patch; Refill: 0  5. Nephrolithiasis -No active pain dysuria or hematuria at this time. -If he passes a stone in the future I advised that he keep it and bring it to the laboratory for analysis  6. Attention deficit hyperactivity disorder (ADHD), unspecified ADHD type -Continue follow-up with his psychiatrist -GAD positive today.  Again this can be managed by his psychiatrist  7. Encounter for annual physical exam - CBC - Comprehensive metabolic panel - Lipid panel  Patient was given the opportunity to ask questions.  Patient verbalized understanding of the plan and was able to repeat key elements of the plan.   Orders Placed This Encounter  Procedures  . Tdap vaccine greater than or equal to 7yo IM  . CBC  . Comprehensive metabolic panel  . Lipid panel  . Ambulatory referral to Dermatology     Requested Prescriptions   Signed Prescriptions Disp Refills  . triamcinolone ointment (KENALOG) 0.5 % 30 g 2    Sig: Apply 1 application topically 2 (two) times daily.  . carbamide peroxide (DEBROX) 6.5 % otic solution 15 mL 0    Sig: Place 5 drops into the right ear 2 (two) times daily.  . nicotine (NICODERM CQ - DOSED IN MG/24 HOURS) 14 mg/24hr patch 28 patch 0    Sig: Place 1 patch (14 mg total) onto the skin daily.  . nicotine (NICODERM CQ - DOSED IN MG/24 HOURS) 21 mg/24hr patch 28 patch 0    Sig: Place 1 patch (21 mg total) onto the skin daily.  . nicotine (NICODERM CQ - DOSED IN MG/24 HR) 7 mg/24hr patch 28 patch 0    Sig: Place 1 patch (7 mg total) onto the skin daily.    Return if symptoms worsen or fail to improve.  Jonah Blue, MD, FACP

## 2017-04-13 ENCOUNTER — Ambulatory Visit: Payer: Self-pay | Attending: Internal Medicine

## 2017-04-13 DIAGNOSIS — Z0000 Encounter for general adult medical examination without abnormal findings: Secondary | ICD-10-CM | POA: Insufficient documentation

## 2017-04-13 NOTE — Progress Notes (Signed)
Patient here for lab visit only 

## 2017-04-14 ENCOUNTER — Other Ambulatory Visit: Payer: Self-pay | Admitting: Internal Medicine

## 2017-04-14 DIAGNOSIS — Z114 Encounter for screening for human immunodeficiency virus [HIV]: Secondary | ICD-10-CM

## 2017-04-14 DIAGNOSIS — R7989 Other specified abnormal findings of blood chemistry: Secondary | ICD-10-CM

## 2017-04-14 LAB — COMPREHENSIVE METABOLIC PANEL
ALBUMIN: 4.7 g/dL (ref 3.5–5.5)
ALT: 39 IU/L (ref 0–44)
AST: 36 IU/L (ref 0–40)
Albumin/Globulin Ratio: 2.5 — ABNORMAL HIGH (ref 1.2–2.2)
Alkaline Phosphatase: 76 IU/L (ref 39–117)
BUN/Creatinine Ratio: 21 — ABNORMAL HIGH (ref 9–20)
BUN: 16 mg/dL (ref 6–20)
Bilirubin Total: <0.2 mg/dL (ref 0.0–1.2)
CALCIUM: 10.3 mg/dL — AB (ref 8.7–10.2)
CO2: 23 mmol/L (ref 18–29)
CREATININE: 0.77 mg/dL (ref 0.76–1.27)
Chloride: 104 mmol/L (ref 96–106)
GFR calc Af Amer: 137 mL/min/{1.73_m2} (ref >59)
GFR, EST NON AFRICAN AMERICAN: 118 mL/min/{1.73_m2} (ref >59)
GLOBULIN, TOTAL: 1.9 g/dL (ref 1.5–4.5)
Glucose: 90 mg/dL (ref 65–99)
Potassium: 4.7 mmol/L (ref 3.5–5.2)
SODIUM: 144 mmol/L (ref 134–144)
Total Protein: 6.6 g/dL (ref 6.0–8.5)

## 2017-04-14 LAB — CBC
HEMATOCRIT: 44.6 % (ref 37.5–51.0)
HEMOGLOBIN: 15.1 g/dL (ref 13.0–17.7)
MCH: 31.4 pg (ref 26.6–33.0)
MCHC: 33.9 g/dL (ref 31.5–35.7)
MCV: 93 fL (ref 79–97)
Platelets: 398 10*3/uL — ABNORMAL HIGH (ref 150–379)
RBC: 4.81 x10E6/uL (ref 4.14–5.80)
RDW: 13.6 % (ref 12.3–15.4)
WBC: 13.1 10*3/uL — AB (ref 3.4–10.8)

## 2017-04-14 LAB — LIPID PANEL
Chol/HDL Ratio: 5.1 ratio — ABNORMAL HIGH (ref 0.0–5.0)
Cholesterol, Total: 228 mg/dL — ABNORMAL HIGH (ref 100–199)
HDL: 45 mg/dL (ref >39)
LDL CALC: 133 mg/dL — AB (ref 0–99)
TRIGLYCERIDES: 251 mg/dL — AB (ref 0–149)
VLDL CHOLESTEROL CAL: 50 mg/dL — AB (ref 5–40)

## 2017-04-20 ENCOUNTER — Ambulatory Visit: Payer: Self-pay | Attending: Internal Medicine

## 2017-08-02 ENCOUNTER — Encounter: Payer: Self-pay | Admitting: Internal Medicine

## 2017-08-02 NOTE — Progress Notes (Signed)
[facility] Patient was seen by Dr. Venancio PoissonLaura Lomax. Diagnosed with psoriasis and onycholysis. Patient placed on hydrocortisone topical ointment 2.5% to apply on the skin of the face, groin area twice a day for a few weeks and then when necessary for flares Placed on clobetasol 0.05% topical cream. Apply to thick plaques twice a day 2 weeks, D/C 2 weeks and restart 2 weeks as directed Calcipotriene 0.005% topical cream QD to BID

## 2018-06-29 ENCOUNTER — Encounter (HOSPITAL_COMMUNITY): Payer: Self-pay

## 2018-06-29 ENCOUNTER — Emergency Department (HOSPITAL_COMMUNITY)
Admission: EM | Admit: 2018-06-29 | Discharge: 2018-06-29 | Disposition: A | Discharge status: Home / Self Care | Payer: Self-pay | Attending: Emergency Medicine | Admitting: Emergency Medicine

## 2018-06-29 ENCOUNTER — Other Ambulatory Visit: Payer: Self-pay

## 2018-06-29 DIAGNOSIS — F1110 Opioid abuse, uncomplicated: Secondary | ICD-10-CM | POA: Insufficient documentation

## 2018-06-29 DIAGNOSIS — F1510 Other stimulant abuse, uncomplicated: Secondary | ICD-10-CM | POA: Insufficient documentation

## 2018-06-29 DIAGNOSIS — R51 Headache: Secondary | ICD-10-CM | POA: Insufficient documentation

## 2018-06-29 DIAGNOSIS — R110 Nausea: Secondary | ICD-10-CM | POA: Insufficient documentation

## 2018-06-29 DIAGNOSIS — F17210 Nicotine dependence, cigarettes, uncomplicated: Secondary | ICD-10-CM | POA: Insufficient documentation

## 2018-06-29 LAB — RAPID URINE DRUG SCREEN, HOSP PERFORMED
Amphetamines: POSITIVE — AB
BARBITURATES: NOT DETECTED
BENZODIAZEPINES: NOT DETECTED
Cocaine: NOT DETECTED
Opiates: NOT DETECTED
Tetrahydrocannabinol: NOT DETECTED

## 2018-06-29 LAB — COMPREHENSIVE METABOLIC PANEL
ALT: 76 U/L — AB (ref 0–44)
AST: 56 U/L — AB (ref 15–41)
Albumin: 4.7 g/dL (ref 3.5–5.0)
Alkaline Phosphatase: 66 U/L (ref 38–126)
Anion gap: 9 (ref 5–15)
BUN: 8 mg/dL (ref 6–20)
CHLORIDE: 100 mmol/L (ref 98–111)
CO2: 27 mmol/L (ref 22–32)
CREATININE: 0.87 mg/dL (ref 0.61–1.24)
Calcium: 9.8 mg/dL (ref 8.9–10.3)
GFR calc Af Amer: >60 mL/min (ref >60)
GFR calc non Af Amer: >60 mL/min (ref >60)
Glucose, Bld: 125 mg/dL — ABNORMAL HIGH (ref 70–99)
Potassium: 3.4 mmol/L — ABNORMAL LOW (ref 3.5–5.1)
SODIUM: 136 mmol/L (ref 135–145)
Total Bilirubin: 1 mg/dL (ref 0.3–1.2)
Total Protein: 7.4 g/dL (ref 6.5–8.1)

## 2018-06-29 LAB — CBC
HCT: 45.2 % (ref 39.0–52.0)
Hemoglobin: 15.3 g/dL (ref 13.0–17.0)
MCH: 30.1 pg (ref 26.0–34.0)
MCHC: 33.8 g/dL (ref 30.0–36.0)
MCV: 89 fL (ref 78.0–100.0)
PLATELETS: 388 10*3/uL (ref 150–400)
RBC: 5.08 MIL/uL (ref 4.22–5.81)
RDW: 12.1 % (ref 11.5–15.5)
WBC: 9.1 10*3/uL (ref 4.0–10.5)

## 2018-06-29 LAB — ETHANOL: Alcohol, Ethyl (B): <10 mg/dL (ref <10)

## 2018-06-29 MED ORDER — NALOXONE HCL 4 MG/0.1ML NA LIQD: 1.0000 | Freq: Once | NASAL | 0 refills | Status: AC

## 2018-06-29 MED ORDER — IBUPROFEN 400 MG PO TABS
600.0000 mg | ORAL_TABLET | Freq: Once | ORAL | Status: AC
  Administered 2018-06-29: 600 mg via ORAL
  Filled 2018-06-29: qty 1

## 2018-06-29 MED ORDER — ONDANSETRON 4 MG PO TBDP
4.0000 mg | ORAL_TABLET | Freq: Once | ORAL | Status: AC
  Administered 2018-06-29: 4 mg via ORAL
  Filled 2018-06-29: qty 1

## 2018-06-29 MED ORDER — ONDANSETRON HCL 4 MG PO TABS: 4.0000 mg | ORAL_TABLET | Freq: Four times a day (QID) | ORAL | 0 refills | Status: DC

## 2018-06-29 MED ORDER — NALOXONE HCL 4 MG/0.1ML NA LIQD: 1.0000 | Freq: Once | NASAL | 0 refills | Status: DC

## 2018-06-29 NOTE — ED Provider Notes (Signed)
[facility] EMERGENCY DEPARTMENT Provider Note   CSN: [id number] Arrival date & time: 06/29/18  1144     History   Chief Complaint Chief Complaint  Patient presents with  . Drug Problem    HPI Dustin Wright is a 37 y.o. male with no pertinent past medical history who presents to the emergency department with a chief complaint of "I need to get clean."  The patient reports that he has been using over 100 mg of Percocet daily for months.  He states "I spent over $10,000 on opioids in the last 3 months."  He has also recently started using Adderall and took 90 mg last night.  He denies IV drug use, other recreational drug use, alcohol, or marijuana use.  He was previously smoking cigarettes, but has been vaping for the last 2 to 3 months.  States that he does not have access to Narcan at home and has never had an overdose.  He reports an associated global headache and nausea.  He denies a fever, chills, chest pain, shortness of breath, vomiting, or abdominal pain.  He reports that he was previously able to stop using opioids for about a month and a half because the individual who supplied him with the pills was out of town, but he began using again as soon as they were available.  He denies SI, HI, or auditory or visual hallucinations.  The history is provided by the patient. No language interpreter was used.  Drug Problem  Associated symptoms include headaches. Pertinent negatives include no chest pain, no abdominal pain and no shortness of breath.    Past Medical History:  Diagnosis Date  . ADHD (attention deficit hyperactivity disorder)    per report, no records, reports taking adderall in the past    . Arthritis   . Chronic lower back pain   . Kidney stone     Patient Active Problem List   Diagnosis Date Noted  . Dental cavity 04/12/2017  . Tobacco dependence 04/12/2017  . Chronic dental pain 04/04/2016  . Right ureteral stone 04/04/2016  .  History of ADHD 04/04/2016  . Psoriasis 04/04/2016    Past Surgical History:  Procedure Laterality Date  . HERNIA REPAIR          Home Medications    Prior to Admission medications   Medication Sig Start Date End Date Taking? Authorizing Provider  naloxone [facility]) nasal spray 4 mg/0.1 mL Place 1 spray into the nose once for 1 dose. 06/29/18 06/29/18  Gwendloyn Forsee A, PA-C  ondansetron (ZOFRAN) 4 MG tablet Take 1 tablet (4 mg total) by mouth every 6 (six) hours. 06/29/18   Latoyia Tecson A, PA-C  triamcinolone ointment (KENALOG) 0.5 % Apply 1 application topically 2 (two) times daily. Patient not taking: Reported on 06/29/2018 04/12/17   Marcine Matar, MD    Family History Family History  Problem Relation Age of Onset  . Hypertension Mother     Social History Social History   Tobacco Use  . Smoking status: Current Every Day Smoker    Packs/day: 1.00    Years: 11.00    Pack years: 11.00    Types: Cigarettes  . Smokeless tobacco: Current User  Substance Use Topics  . Alcohol use: No  . Drug use: No    Frequency: 3.0 times per week     Allergies   Patient has no known allergies.   Review of Systems Review of Systems  Constitutional: Negative for  appetite change and fever.  Respiratory: Negative for shortness of breath.   Cardiovascular: Negative for chest pain.  Gastrointestinal: Negative for abdominal pain.  Genitourinary: Negative for dysuria.  Musculoskeletal: Negative for back pain.  Skin: Negative for rash.  Allergic/Immunologic: Negative for immunocompromised state.  Neurological: Positive for headaches.  Psychiatric/Behavioral: Negative for agitation, confusion, hallucinations, sleep disturbance and suicidal ideas.     Physical Exam Updated Vital Signs BP (!) 129/97 (BP Location: Right Arm)   Pulse 74   Temp 97.8 F (36.6 C) (Oral)   Resp 16   Ht 5\' 11"  (1.803 m)   Wt 72.6 kg   SpO2 98%   BMI 22.32 kg/m   Physical Exam  Constitutional:  He appears well-developed. No distress.  Well-appearing, no acute distress.  HENT:  Head: Normocephalic.  Eyes: Pupils are equal, round, and reactive to light. Conjunctivae and EOM are normal.  Pupils are equal, round, and reactive.  Neck: Neck supple.  Cardiovascular: Normal rate, regular rhythm, normal heart sounds and intact distal pulses. Exam reveals no gallop and no friction rub.  No murmur heard. Pulmonary/Chest: Effort normal and breath sounds normal. No stridor. No respiratory distress. He has no wheezes. He has no rales. He exhibits no tenderness.  Abdominal: Soft. Bowel sounds are normal. He exhibits no distension and no mass. There is no tenderness. There is no rebound and no guarding. No hernia.  Neurological: He is alert.  No slurred speech.  Skin: Skin is warm and dry. He is not diaphoretic.  Psychiatric: His behavior is normal.  Nursing note and vitals reviewed.  ED Treatments / Results  Labs (all labs ordered are listed, but only abnormal results are displayed) Labs Reviewed  COMPREHENSIVE METABOLIC PANEL - Abnormal; Notable for the following components:      Result Value   Potassium 3.4 (*)    Glucose, Bld 125 (*)    AST 56 (*)    ALT 76 (*)    All other components within normal limits  RAPID URINE DRUG SCREEN, HOSP PERFORMED - Abnormal; Notable for the following components:   Amphetamines POSITIVE (*)    All other components within normal limits  ETHANOL  CBC    EKG None  Radiology No results found.  Procedures Procedures (including critical care time)  Medications Ordered in ED Medications  ibuprofen (ADVIL,MOTRIN) tablet 600 mg (600 mg Oral Given 06/29/18 1304)  ondansetron (ZOFRAN-ODT) disintegrating tablet 4 mg (4 mg Oral Given 06/29/18 1303)     Initial Impression / Assessment and Plan / ED Course  I have reviewed the triage vital signs and the nursing notes.  Pertinent labs & imaging results that were available during my care of the patient  were reviewed by me and considered in my medical decision making (see chart for details).     37 year old male with no pertinent past medical history presenting to the emergency department with opioid and amphetamine use.  He reports daily opioid use of approximately 100 mg.  He is requesting treatment for detoxification.  Peer consult placed.  Spoke with Morrie Sheldon from social work who will provide the patient with resources.  Ibuprofen given for headache in the ED and Zofran for nausea.  He denies SI, HI, or auditory or visual hallucinations at this time.    Labs are notable for AST 56 ALT 76.  No history of IV drug use.  Denies alcohol use.  Will provide the patient with a referral to [facility] for follow-up.  Substance  abuse resources given.  He is hemodynamically stable and in no acute distress.  He is safe for discharge home at this time.  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

## 2018-06-29 NOTE — ED Triage Notes (Signed)
Pt states the he last used 90 mg Adderall last night, and percocet yesterday. Pt reports that he using pills, anything he can get. Denies ETOH, denies IDU.

## 2018-06-29 NOTE — Discharge Instructions (Addendum)
Thank you for allowing me to care for you today in the Emergency Department.   Please use the resource guide to assist you with finding placement for drug treatment and rehabilitation.  Sometimes you need to call all of the resources on the guide for treatment availability.  Take 600 mg of ibuprofen with food every 6 hours as needed for headache.  You can take 1 tablet of Zofran every 6 hours as needed for nausea or vomiting.  I have also placed a consult with 1 of our peer mentors.  They may be given you a call within the next 24 hours.  Return to the emergency department if you develop chest pain, thoughts of wanting to hurt or kill yourself or others, seeing or hearing things that may not actually be there, or other new, concerning symptoms.

## 2018-06-29 NOTE — ED Notes (Signed)
Patient verbalizes understanding of discharge instructions. Opportunity for questioning and answers were provided. Armband removed by staff, pt discharged from ED.  

## 2018-06-29 NOTE — ED Notes (Signed)
CSW at bedside.

## 2018-07-29 ENCOUNTER — Ambulatory Visit: Payer: Self-pay

## 2018-10-24 ENCOUNTER — Encounter (HOSPITAL_COMMUNITY): Payer: Self-pay | Admitting: Emergency Medicine

## 2018-10-24 ENCOUNTER — Emergency Department (HOSPITAL_COMMUNITY)
Admission: EM | Admit: 2018-10-24 | Discharge: 2018-10-24 | Disposition: A | Discharge status: Home / Self Care | Payer: Self-pay | Attending: Emergency Medicine | Admitting: Emergency Medicine

## 2018-10-24 ENCOUNTER — Emergency Department (HOSPITAL_COMMUNITY): Payer: Self-pay

## 2018-10-24 ENCOUNTER — Other Ambulatory Visit: Payer: Self-pay

## 2018-10-24 ENCOUNTER — Encounter (HOSPITAL_COMMUNITY): Payer: Self-pay

## 2018-10-24 ENCOUNTER — Ambulatory Visit (HOSPITAL_COMMUNITY)
Admission: EM | Admit: 2018-10-24 | Discharge: 2018-10-24 | Disposition: A | Discharge status: Home / Self Care | Payer: Self-pay

## 2018-10-24 DIAGNOSIS — F909 Attention-deficit hyperactivity disorder, unspecified type: Secondary | ICD-10-CM | POA: Insufficient documentation

## 2018-10-24 DIAGNOSIS — Z79899 Other long term (current) drug therapy: Secondary | ICD-10-CM | POA: Insufficient documentation

## 2018-10-24 DIAGNOSIS — R079 Chest pain, unspecified: Secondary | ICD-10-CM

## 2018-10-24 DIAGNOSIS — R0789 Other chest pain: Secondary | ICD-10-CM

## 2018-10-24 DIAGNOSIS — M79602 Pain in left arm: Secondary | ICD-10-CM

## 2018-10-24 DIAGNOSIS — F17210 Nicotine dependence, cigarettes, uncomplicated: Secondary | ICD-10-CM | POA: Insufficient documentation

## 2018-10-24 LAB — CBC
HCT: 43 % (ref 39.0–52.0)
Hemoglobin: 14.4 g/dL (ref 13.0–17.0)
MCH: 29.8 pg (ref 26.0–34.0)
MCHC: 33.5 g/dL (ref 30.0–36.0)
MCV: 88.8 fL (ref 80.0–100.0)
PLATELETS: 335 10*3/uL (ref 150–400)
RBC: 4.84 MIL/uL (ref 4.22–5.81)
RDW: 12.4 % (ref 11.5–15.5)
WBC: 8 10*3/uL (ref 4.0–10.5)
nRBC: 0 % (ref 0.0–0.2)

## 2018-10-24 LAB — BASIC METABOLIC PANEL
Anion gap: 10 (ref 5–15)
BUN: 13 mg/dL (ref 6–20)
CALCIUM: 9.3 mg/dL (ref 8.9–10.3)
CO2: 24 mmol/L (ref 22–32)
CREATININE: 0.83 mg/dL (ref 0.61–1.24)
Chloride: 107 mmol/L (ref 98–111)
GFR calc Af Amer: >60 mL/min (ref >60)
GFR calc non Af Amer: >60 mL/min (ref >60)
GLUCOSE: 72 mg/dL (ref 70–99)
Potassium: 3.9 mmol/L (ref 3.5–5.1)
Sodium: 141 mmol/L (ref 135–145)

## 2018-10-24 LAB — I-STAT TROPONIN, ED: Troponin i, poc: 0 ng/mL (ref 0.00–0.08)

## 2018-10-24 NOTE — ED Notes (Signed)
Patient verbalizes understanding of discharge instructions. Opportunity for questioning and answers were provided. Armband removed by staff, pt discharged from ED.  

## 2018-10-24 NOTE — ED Triage Notes (Signed)
[facility] sided chest pain with radiation to L arm. Pt reports pain is intermittent and seems to be associated with high stress and work. Pt reports current pain is 3/10.

## 2018-10-24 NOTE — ED Notes (Signed)
Pt states he needs to pick up his kids at 1730

## 2018-10-24 NOTE — ED Triage Notes (Addendum)
Left chest pain, tightness, radiating down left arm.  Pain started Sunday and is intermittent.   No nausea, no vomiting, no sob.  Currently patient is hurting  Drinks energy drinks. Patient is anxious.

## 2018-10-24 NOTE — ED Provider Notes (Signed)
[facility] EMERGENCY DEPARTMENT Provider Note   CSN: [id number] Arrival date & time: 10/24/18  1413     History   Chief Complaint Chief Complaint  Patient presents with  . Chest Pain    HPI Dustin Wright is a 37 y.o. male.  HPI  37 year old male presents with left-sided chest pain.  Patient states that he is a former drug addict that used to abuse opiates and amphetamines.  However he states his been clean for 4months.  Now he is having left-sided chest pain starting 3 days ago.  Starts usually at work while he is doing heavy lifting and activity.  However the first night it also occurred again at home when he was stressed out by his kids.  Always resolves when he relaxes and rests.  Did not happen the next day but has happened yesterday and then again today.  The pain always radiates down his left arm.  Feels like a tightness in his chest.  It is on the lateral aspect of his chest near his shoulder.  No shortness of breath, diaphoresis, nausea/vomiting.  No leg swelling.  He has been working out during this time as well and it never seems to bother him then.  He is also worried because he drinks 2 monster energy drinks per day and has read information about these causing heart disease.  He does get palpitations on and off but not related to this chest pain and not currently.  No known history of hypertension, hyperlipidemia, diabetes, or early coronary history.  The pain currently is subsiding and is minimal.  Past Medical History:  Diagnosis Date  . ADHD (attention deficit hyperactivity disorder)    per report, no records, reports taking adderall in the past    . Arthritis   . Chronic lower back pain   . Kidney stone     Patient Active Problem List   Diagnosis Date Noted  . Dental cavity 04/12/2017  . Tobacco dependence 04/12/2017  . Chronic dental pain 04/04/2016  . Right ureteral stone 04/04/2016  . History of ADHD 04/04/2016  . Psoriasis 04/04/2016      Past Surgical History:  Procedure Laterality Date  . HERNIA REPAIR          Home Medications    Prior to Admission medications   Medication Sig Start Date End Date Taking? Authorizing Provider  BIOTIN PO Take 1 capsule by mouth daily.    Yes [provider]  Multiple Vitamin (MULTIVITAMIN) capsule Take 1 capsule by mouth daily.   Yes [provider]  Omega-3 Fatty Acids (FISH OIL PO) Take 1,200 mg by mouth daily.    Yes [provider]  ondansetron (ZOFRAN) 4 MG tablet Take 1 tablet (4 mg total) by mouth every 6 (six) hours. Patient not taking: Reported on 10/24/2018 06/29/18   McDonald, Pedro Earls A, PA-C  triamcinolone ointment (KENALOG) 0.5 % Apply 1 application topically 2 (two) times daily. Patient not taking: Reported on 06/29/2018 04/12/17   Marcine Matar, MD    Family History Family History  Problem Relation Age of Onset  . Hypertension Mother     Social History Social History   Tobacco Use  . Smoking status: Current Every Day Smoker    Packs/day: 1.00    Years: 11.00    Pack years: 11.00    Types: Cigarettes  . Smokeless tobacco: Current User  Substance Use Topics  . Alcohol use: No  . Drug use: No  Frequency: 3.0 times per week    Comment: opiods-clean for 4 month     Allergies   Patient has no known allergies.   Review of Systems Review of Systems  Constitutional: Negative for diaphoresis.  Respiratory: Negative for shortness of breath.   Cardiovascular: Positive for chest pain and palpitations. Negative for leg swelling.  Gastrointestinal: Negative for abdominal pain.  All other systems reviewed and are negative.    Physical Exam Updated Vital Signs BP 123/82   Pulse 66   Temp 98.1 F (36.7 C) (Oral)   Resp 17   SpO2 97%   Physical Exam  Constitutional: He appears well-developed and well-nourished. No distress.  HENT:  Head: Normocephalic and atraumatic.  Right Ear: External ear normal.  Left Ear:  External ear normal.  Nose: Nose normal.  Eyes: Right eye exhibits no discharge. Left eye exhibits no discharge.  Neck: Neck supple.  Cardiovascular: Normal rate, regular rhythm and normal heart sounds.  Pulses:      Radial pulses are 2+ on the right side, and 2+ on the left side.  Pulmonary/Chest: Effort normal and breath sounds normal. He exhibits no tenderness.  No reproducible pain or pain with movement of left shoulder  Abdominal: Soft. There is no tenderness.  Musculoskeletal: He exhibits no edema.  Neurological: He is alert.  Skin: Skin is warm and dry. He is not diaphoretic.  Psychiatric: His mood appears not anxious.  Nursing note and vitals reviewed.    ED Treatments / Results  Labs (all labs ordered are listed, but only abnormal results are displayed) Labs Reviewed  BASIC METABOLIC PANEL  CBC  I-STAT TROPONIN, ED    EKG EKG Interpretation  Date/Time:  Thursday October 24 2018 14:29:27 EST Ventricular Rate:  86 PR Interval:    QRS Duration: 87 QT Interval:  339 QTC Calculation: 406 R Axis:   58 Text Interpretation:  Sinus rhythm Abnormal R-wave progression, early transition nonspecific T waves.  no significant change since earlier in the day similar when compared to 2010 Confirmed by Pricilla Loveless [phone]) on 10/24/2018 2:38:06 PM   Radiology Dg Chest 2 View  Result Date: 10/24/2018 CLINICAL DATA:  Severe left chest and arm pain. EXAM: CHEST - 2 VIEW COMPARISON:  10/28/2009 FINDINGS: The heart size and mediastinal contours are within normal limits. Both lungs are clear. Moderate hyperinflation. The visualized skeletal structures are unremarkable. IMPRESSION: Hyperinflation but no obvious bullous changes. No acute pulmonary findings. Electronically Signed   By: Rudie Meyer M.D.   On: 10/24/2018 15:26    Procedures Procedures (including critical care time)  Medications Ordered in ED Medications - No data to display   Initial Impression / Assessment and  Plan / ED Course  I have reviewed the triage vital signs and the nursing notes.  Pertinent labs & imaging results that were available during my care of the patient were reviewed by me and considered in my medical decision making (see chart for details).     Patient is unlikely to have ACS.  However with his symptoms ECG and troponin and other labs obtained.  These and the chest x-ray are benign.  I discussed that technically to rule out heart attack we would need one more troponin but he states he cannot stay longer needs to go.  I think this is reasonable given my lower suspicion of MI.  I discussed that cardiac disease altogether is not ruled out and he will need to follow with cardiology.  Otherwise we discussed return  precautions.  I highly doubt PE and he is PERC negative.  Doubt dissection.  Final Clinical Impressions(s) / ED Diagnoses   Final diagnoses:  Nonspecific chest pain    ED Discharge Orders    None       Pricilla LovelessGoldston, Gear, MD 10/24/18 [phone]

## 2018-10-24 NOTE — Discharge Instructions (Addendum)
If you develop worsening chest pain, trouble breathing, dizziness, or any other new/concerning symptoms then return to the ER for evaluation

## 2018-10-24 NOTE — Discharge Instructions (Signed)
Go to ER   You should call someone else to pick up your children

## 2019-06-25 ENCOUNTER — Encounter (INDEPENDENT_AMBULATORY_CARE_PROVIDER_SITE_OTHER): Payer: Self-pay | Admitting: Primary Care

## 2019-06-25 ENCOUNTER — Ambulatory Visit (INDEPENDENT_AMBULATORY_CARE_PROVIDER_SITE_OTHER): Payer: Self-pay | Admitting: Primary Care

## 2019-06-25 ENCOUNTER — Other Ambulatory Visit: Payer: Self-pay

## 2019-06-25 DIAGNOSIS — R51 Headache: Secondary | ICD-10-CM

## 2019-06-25 DIAGNOSIS — R5383 Other fatigue: Secondary | ICD-10-CM

## 2019-06-25 DIAGNOSIS — Z20828 Contact with and (suspected) exposure to other viral communicable diseases: Secondary | ICD-10-CM

## 2019-06-25 DIAGNOSIS — Z20822 Contact with and (suspected) exposure to covid-19: Secondary | ICD-10-CM

## 2019-06-25 NOTE — Progress Notes (Signed)
Virtual Visit via Telephone Note  I connected with Andres Ege on 06/25/19 at  4:10 PM EDT by telephone and verified that I am speaking with the correct person using two identifiers.   I discussed the limitations, risks, security and privacy concerns of performing an evaluation and management service by telephone and the availability of in person appointments. I also discussed with the patient that there may be a patient responsible charge related to this service. The patient expressed understanding and agreed to proceed.   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    Observations/Objective: Review of Systems  Constitutional: Positive for malaise/fatigue.  Neurological: Positive for headaches.   Assessment and Plan: Coronavirus ([id number]) Are you at risk?  Are you at risk for the Coronavirus (COVID-19)?  To be considered HIGH RISK for Coronavirus (COVID-19), you have to meet the following criteria:  . Traveled to [place], [place], [place], [place] or [place]; or in the [place] to [place], [place], [place], or [place]; and have fever, cough, and shortness of breath within the last 2 weeks of travel OR . Been in close contact with a person diagnosed with COVID-19 within the last 2 weeks and have fever, cough, and shortness of breath . IF YOU DO NOT MEET THESE CRITERIA, YOU ARE CONSIDERED LOW RISK FOR COVID-19.  What to do if you are HIGH RISK for COVID-19?  Marland Kitchen If you are having a medical emergency, call 911. . Seek medical care right away. Before you go to a doctor's office, urgent care or emergency department, call ahead and tell them about your recent travel, contact with someone diagnosed with COVID-19, and your symptoms. You should receive instructions from your physician's office regarding next steps of care.  . When you  arrive at healthcare provider, tell the healthcare staff immediately you have returned from visiting [place], [place], [place], [place] or [place]; or traveled in the [place] to [place], [place], [place], or [place]; in the last two weeks or you have been in close contact with a person diagnosed with COVID-19 in the last 2 weeks.   . Tell the health care staff about your symptoms: fever, cough and shortness of breath. . After you have been seen by a medical provider, you will be either: o Tested for (COVID-19) and discharged home on quarantine except to seek medical care if symptoms worsen, and asked to  - Stay home and avoid contact with others until you get your results (4-5 days)  - Avoid travel on public transportation if possible (such as bus, train, or airplane) or o Sent to the Emergency Department by EMS for evaluation, COVID-19 testing, and possible admission depending on your condition and test results.  What to do if you are LOW RISK for COVID-19?  Reduce your risk of any infection by using the same precautions used for avoiding the common cold or flu:  Marland Kitchen Wash your hands often with soap and warm water for at least 20 seconds.  If soap and water are not readily available, use an alcohol-based hand sanitizer with at least 60% alcohol.  . If coughing or sneezing, cover your mouth and nose by coughing or sneezing into the elbow areas of your shirt or coat, into a tissue or into your sleeve (not your hands). . Avoid shaking hands with others and consider  head nods or verbal greetings only. . Avoid touching your eyes, nose, or mouth with unwashed hands.  . Avoid close contact with people who are sick. . Avoid places or events with large numbers of people in one location, like concerts or sporting events. . Carefully consider travel plans you have or are making. . If you are planning any travel outside or inside the [place], visit the CDC's Travelers' Health webpage for the latest health  notices. . If you have some symptoms but not all symptoms, continue to monitor at home and seek medical attention if your symptoms worsen. . If you are having a medical emergency, call 911.   ADDITIONAL HEALTHCARE OPTIONS FOR PATIENTS  [facility] / e-Visit: [email or web address]         [facility]: [phone]  [facility]: [phone]                   [facility]: [phone]   Follow Up Instructions:    I discussed the assessment and treatment plan with the patient. The patient was provided an opportunity to ask questions and all were answered. The patient agreed with the plan and demonstrated an understanding of the instructions.   The patient was advised to call back or seek an in-person evaluation if the symptoms worsen or if the condition fails to improve as anticipated.  I provided 10 minutes of non-face-to-face time during this encounter.   Grayce SessionsMichelle P Edwards, NP

## 2019-06-26 ENCOUNTER — Ambulatory Visit: Payer: Self-pay | Attending: Internal Medicine

## 2019-06-26 ENCOUNTER — Encounter (INDEPENDENT_AMBULATORY_CARE_PROVIDER_SITE_OTHER): Payer: Self-pay | Admitting: Primary Care

## 2019-06-26 ENCOUNTER — Telehealth: Payer: Self-pay

## 2019-06-26 DIAGNOSIS — Z20828 Contact with and (suspected) exposure to other viral communicable diseases: Secondary | ICD-10-CM

## 2019-06-26 DIAGNOSIS — Z20822 Contact with and (suspected) exposure to covid-19: Secondary | ICD-10-CM

## 2019-06-26 NOTE — Telephone Encounter (Signed)
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 Pt has to turn it in to employer today before 5 pm. Please have Dr. Wynetta Emery sign the letter.

## 2019-06-26 NOTE — Telephone Encounter (Signed)
Patient concerns has been addressed.  

## 2019-06-27 NOTE — Progress Notes (Signed)
Patient came to get tested for COVID.

## 2019-06-28 ENCOUNTER — Encounter (INDEPENDENT_AMBULATORY_CARE_PROVIDER_SITE_OTHER): Payer: Self-pay | Admitting: Primary Care

## 2019-06-28 LAB — NOVEL CORONAVIRUS, NAA: SARS-CoV-2, NAA: NOT DETECTED

## 2019-06-30 ENCOUNTER — Telehealth (INDEPENDENT_AMBULATORY_CARE_PROVIDER_SITE_OTHER): Payer: Self-pay

## 2019-06-30 ENCOUNTER — Encounter (INDEPENDENT_AMBULATORY_CARE_PROVIDER_SITE_OTHER): Payer: Self-pay

## 2019-06-30 NOTE — Telephone Encounter (Signed)
Left voicemail asking patient to return call to [facility] at [phone]. Tempestt S Roberts, CMA  

## 2019-06-30 NOTE — Telephone Encounter (Signed)
-----   Message from Michelle P Edwards, NP sent at 06/28/2019 10:45 AM EDT ----- Your test for COVID-19 was negative 

## 2019-06-30 NOTE — Telephone Encounter (Signed)
Patient returned call to [facility] and verified date birth. He was then notified that his COVID results are negative. Results mailed to patient per his request. Nat Christen, CMA

## 2019-06-30 NOTE — Telephone Encounter (Signed)
-----   Message from Kerin Perna, NP sent at 06/28/2019 10:45 AM EDT ----- Your test for COVID-19 was negative

## 2019-07-13 ENCOUNTER — Other Ambulatory Visit: Payer: Self-pay

## 2019-07-13 ENCOUNTER — Emergency Department (HOSPITAL_COMMUNITY)
Admission: EM | Admit: 2019-07-13 | Discharge: 2019-07-13 | Disposition: A | Discharge status: Home / Self Care | Payer: Self-pay | Attending: Emergency Medicine | Admitting: Emergency Medicine

## 2019-07-13 ENCOUNTER — Encounter (HOSPITAL_COMMUNITY): Payer: Self-pay | Admitting: Emergency Medicine

## 2019-07-13 ENCOUNTER — Encounter: Payer: Self-pay | Admitting: Internal Medicine

## 2019-07-13 DIAGNOSIS — Z5321 Procedure and treatment not carried out due to patient leaving prior to being seen by health care provider: Secondary | ICD-10-CM | POA: Insufficient documentation

## 2019-07-13 DIAGNOSIS — R109 Unspecified abdominal pain: Secondary | ICD-10-CM | POA: Insufficient documentation

## 2019-07-13 LAB — URINALYSIS, ROUTINE W REFLEX MICROSCOPIC
Bilirubin Urine: NEGATIVE
Glucose, UA: NEGATIVE mg/dL
Hgb urine dipstick: NEGATIVE
Ketones, ur: NEGATIVE mg/dL
Leukocytes,Ua: NEGATIVE
Nitrite: NEGATIVE
Protein, ur: NEGATIVE mg/dL
Specific Gravity, Urine: 1.016 (ref 1.005–1.030)
pH: 5 (ref 5.0–8.0)

## 2019-07-13 LAB — CBC
HCT: 41.3 % (ref 39.0–52.0)
Hemoglobin: 14.6 g/dL (ref 13.0–17.0)
MCH: 30.5 pg (ref 26.0–34.0)
MCHC: 35.4 g/dL (ref 30.0–36.0)
MCV: 86.4 fL (ref 80.0–100.0)
Platelets: 354 10*3/uL (ref 150–400)
RBC: 4.78 MIL/uL (ref 4.22–5.81)
RDW: 12.5 % (ref 11.5–15.5)
WBC: 9.7 10*3/uL (ref 4.0–10.5)
nRBC: 0 % (ref 0.0–0.2)

## 2019-07-13 LAB — COMPREHENSIVE METABOLIC PANEL
ALT: 35 U/L (ref 0–44)
AST: 28 U/L (ref 15–41)
Albumin: 4.2 g/dL (ref 3.5–5.0)
Alkaline Phosphatase: 76 U/L (ref 38–126)
Anion gap: 9 (ref 5–15)
BUN: 9 mg/dL (ref 6–20)
CO2: 25 mmol/L (ref 22–32)
Calcium: 9.5 mg/dL (ref 8.9–10.3)
Chloride: 105 mmol/L (ref 98–111)
Creatinine, Ser: 0.89 mg/dL (ref 0.61–1.24)
GFR calc Af Amer: >60 mL/min (ref >60)
GFR calc non Af Amer: >60 mL/min (ref >60)
Glucose, Bld: 80 mg/dL (ref 70–99)
Potassium: 3.5 mmol/L (ref 3.5–5.1)
Sodium: 139 mmol/L (ref 135–145)
Total Bilirubin: 0.5 mg/dL (ref 0.3–1.2)
Total Protein: 6.6 g/dL (ref 6.5–8.1)

## 2019-07-13 LAB — LIPASE, BLOOD: Lipase: 65 U/L — ABNORMAL HIGH (ref 11–51)

## 2019-07-13 NOTE — ED Triage Notes (Signed)
Patient reports persistent left lateral abdominal pain " discomfort"  for 2 weeks with mild nausea , no emesis or diarrhea, denies fever or chills .

## 2019-07-13 NOTE — ED Notes (Signed)
Pt ambulatory to sort desk, reporting that he does not want to stay any longer, he wants to go and see his own doctor tomorrow. Encouraged pt to stay, or to return if he wishes.

## 2019-07-14 ENCOUNTER — Encounter (HOSPITAL_COMMUNITY): Payer: Self-pay | Admitting: Emergency Medicine

## 2019-07-14 ENCOUNTER — Emergency Department (HOSPITAL_COMMUNITY)
Admission: EM | Admit: 2019-07-14 | Discharge: 2019-07-14 | Disposition: A | Discharge status: Home / Self Care | Payer: Self-pay | Attending: Emergency Medicine | Admitting: Emergency Medicine

## 2019-07-14 ENCOUNTER — Encounter: Payer: Self-pay | Admitting: Internal Medicine

## 2019-07-14 ENCOUNTER — Other Ambulatory Visit: Payer: Self-pay

## 2019-07-14 DIAGNOSIS — Z20828 Contact with and (suspected) exposure to other viral communicable diseases: Secondary | ICD-10-CM | POA: Insufficient documentation

## 2019-07-14 DIAGNOSIS — R0981 Nasal congestion: Secondary | ICD-10-CM | POA: Insufficient documentation

## 2019-07-14 DIAGNOSIS — R109 Unspecified abdominal pain: Secondary | ICD-10-CM

## 2019-07-14 DIAGNOSIS — Z79899 Other long term (current) drug therapy: Secondary | ICD-10-CM | POA: Insufficient documentation

## 2019-07-14 DIAGNOSIS — F909 Attention-deficit hyperactivity disorder, unspecified type: Secondary | ICD-10-CM | POA: Insufficient documentation

## 2019-07-14 DIAGNOSIS — R05 Cough: Secondary | ICD-10-CM | POA: Insufficient documentation

## 2019-07-14 DIAGNOSIS — R110 Nausea: Secondary | ICD-10-CM | POA: Insufficient documentation

## 2019-07-14 DIAGNOSIS — F17210 Nicotine dependence, cigarettes, uncomplicated: Secondary | ICD-10-CM | POA: Insufficient documentation

## 2019-07-14 DIAGNOSIS — Z87442 Personal history of urinary calculi: Secondary | ICD-10-CM | POA: Insufficient documentation

## 2019-07-14 DIAGNOSIS — J029 Acute pharyngitis, unspecified: Secondary | ICD-10-CM | POA: Insufficient documentation

## 2019-07-14 DIAGNOSIS — Z20822 Contact with and (suspected) exposure to covid-19: Secondary | ICD-10-CM

## 2019-07-14 DIAGNOSIS — R1033 Periumbilical pain: Secondary | ICD-10-CM | POA: Insufficient documentation

## 2019-07-14 LAB — COMPREHENSIVE METABOLIC PANEL
ALT: 39 U/L (ref 0–44)
AST: 33 U/L (ref 15–41)
Albumin: 4.4 g/dL (ref 3.5–5.0)
Alkaline Phosphatase: 82 U/L (ref 38–126)
Anion gap: 10 (ref 5–15)
BUN: 8 mg/dL (ref 6–20)
CO2: 25 mmol/L (ref 22–32)
Calcium: 9.9 mg/dL (ref 8.9–10.3)
Chloride: 104 mmol/L (ref 98–111)
Creatinine, Ser: 0.94 mg/dL (ref 0.61–1.24)
GFR calc Af Amer: >60 mL/min (ref >60)
GFR calc non Af Amer: >60 mL/min (ref >60)
Glucose, Bld: 105 mg/dL — ABNORMAL HIGH (ref 70–99)
Potassium: 3.9 mmol/L (ref 3.5–5.1)
Sodium: 139 mmol/L (ref 135–145)
Total Bilirubin: 0.7 mg/dL (ref 0.3–1.2)
Total Protein: 7 g/dL (ref 6.5–8.1)

## 2019-07-14 LAB — CBC
HCT: 45.1 % (ref 39.0–52.0)
Hemoglobin: 15.7 g/dL (ref 13.0–17.0)
MCH: 30.5 pg (ref 26.0–34.0)
MCHC: 34.8 g/dL (ref 30.0–36.0)
MCV: 87.7 fL (ref 80.0–100.0)
Platelets: 355 10*3/uL (ref 150–400)
RBC: 5.14 MIL/uL (ref 4.22–5.81)
RDW: 12.7 % (ref 11.5–15.5)
WBC: 10.3 10*3/uL (ref 4.0–10.5)
nRBC: 0 % (ref 0.0–0.2)

## 2019-07-14 LAB — URINALYSIS, ROUTINE W REFLEX MICROSCOPIC
Bilirubin Urine: NEGATIVE
Glucose, UA: NEGATIVE mg/dL
Hgb urine dipstick: NEGATIVE
Ketones, ur: NEGATIVE mg/dL
Leukocytes,Ua: NEGATIVE
Nitrite: NEGATIVE
Protein, ur: NEGATIVE mg/dL
Specific Gravity, Urine: 1.01 (ref 1.005–1.030)
pH: 6 (ref 5.0–8.0)

## 2019-07-14 LAB — LIPASE, BLOOD: Lipase: 47 U/L (ref 11–51)

## 2019-07-14 LAB — GROUP A STREP BY PCR: Group A Strep by PCR: NOT DETECTED

## 2019-07-14 MED ORDER — SODIUM CHLORIDE 0.9% FLUSH: 3.0000 mL | Freq: Once | INTRAVENOUS | Status: DC

## 2019-07-14 NOTE — ED Provider Notes (Signed)
[facility] Provider Note   CSN: [id number] Arrival date & time: 07/14/19  1155     History   Chief Complaint Chief Complaint  Patient presents with   Abdominal Pain   URI    HPI Dustin Wright is a 38 y.o. male presents to the ER with 2 concerns.  He reports abdominal pain for the last 2 months.  Also is concerned about possible COVID-19 symptoms that began 2 days ago.  He went to the [facility] but due to wait times left without being seen.  Patient reports having left mid abdominal "swelling" associated with uncomfortable feeling after eating for the last 2 months.  He denies any pain but states he just feels full and uncomfortable.  The symptoms begin a few minutes after eating.  He has associated nausea, burning discomfort, regurgitation after eating, full satiety.  States he has never had acid reflux but in the last month he has had really bad regurgitation especially after eating.  States that if he lays down it is significantly worse and some food will come back up in his throat.  He has been taking Tums but these only help if he eats a lot of them at a time.  He went to the [facility] ER yesterday and had blood work done but did not wait to be seen due to long wait times.  He checked my chart for his blood work results and noticed that his lipase was 61.  States that he got concerned and started googling and is concerned about his pancrease.  He takes daily ibuprofen for chronic bilateral knee pain.  He takes probiotics, [employer] male supplement and a multivitamin every day as well.  He denies any fever, vomiting, hemoptysis, chest pain, shortness of breath, constipation, melena, hematochezia.  States he always has loose bowel movements and attributes this to the probiotic.  No urinary symptoms.  No h/o kidney stones. No alcohol use.  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  In the last week his work is emailed him to let him know about 3 different new positive results at his job site.  He was last working at [employer] and got moved to another state.  His 37-year-old son started any daycare and he came home with subjective fevers, congestion, sore throat 4 days ago.  Now his daughter has similar symptoms.  Patient developed nasal congestion associated with sore throat, dry cough 2 days ago.  No interventions for this.  No alleviating factors.  He denies any fever, chills, headaches, nausea, vomiting, diarrhea, body aches.  He was tested for COVID-19 3 weeks ago but states since then he is concerned he has been exposed to COVID-19 at work.     HPI  Past Medical History:  Diagnosis Date   ADHD (attention deficit hyperactivity disorder)    per report, no records, reports taking adderall in the past     Arthritis    Chronic lower back pain    Kidney stone     Patient Active Problem List   Diagnosis Date Noted   Dental cavity 04/12/2017   Tobacco dependence 04/12/2017   Chronic dental pain 04/04/2016   Right ureteral stone 04/04/2016   History of ADHD 04/04/2016   Psoriasis 04/04/2016    Past Surgical History:  Procedure Laterality Date   HERNIA REPAIR  Home Medications    Prior to Admission medications   Medication Sig Start Date End Date Taking? Authorizing Provider  BIOTIN PO Take 1 capsule by mouth daily.     [provider]  Multiple Vitamin (MULTIVITAMIN) capsule Take 1 capsule by mouth daily.    [provider]  Omega-3 Fatty Acids (FISH OIL PO) Take 1,200 mg by mouth daily.     [provider]    Family History Family History  Problem Relation Age of Onset   Hypertension Mother     Social History Social History   Tobacco Use   Smoking status: Current Every Day Smoker    Packs/day: 1.00    Years: 11.00    Pack years: 11.00    Types: Cigarettes   Smokeless tobacco:  Current User  Substance Use Topics   Alcohol use: No   Drug use: No    Frequency: 3.0 times per week    Comment: opiods-clean for 4 month     Allergies   Patient has no known allergies.   Review of Systems Review of Systems  HENT: Positive for congestion and sore throat.   Respiratory: Positive for cough.   Gastrointestinal: Positive for abdominal pain and nausea.       Early satiety, regurgitation   All other systems reviewed and are negative.    Physical Exam Updated Vital Signs BP 128/78    Pulse 83    Temp 99.6 F (37.6 C) (Oral)    Resp 20    SpO2 99%   Physical Exam Vitals signs and nursing note reviewed.  Constitutional:      Appearance: He is well-developed.     Comments: Non toxic.  HENT:     Head: Normocephalic and atraumatic.     Nose: Nose normal.     Comments: Nasal mucosa is normal without erythema, edema, or rhinorrhea.  Septum is midline.      Mouth/Throat:     Comments: Mild erythema diffusely to soft palate, oropharynx and tonsils.  Uvula is midline.  No tonsillar hypertrophy, asymmetry, petechiae.  Tolerating secretions.  Normal phonation.  No trismus.  Normal sublingual space without fullness. Eyes:     Conjunctiva/sclera: Conjunctivae normal.  Neck:     Musculoskeletal: Normal range of motion.     Comments: No cervical lymphadenopathy.  No anterior neck edema.  Trachea midline. Cardiovascular:     Rate and Rhythm: Normal rate and regular rhythm.     Heart sounds: Normal heart sounds.  Pulmonary:     Effort: Pulmonary effort is normal.     Breath sounds: Normal breath sounds.  Abdominal:     General: Bowel sounds are normal.     Palpations: Abdomen is soft.     Tenderness: There is no abdominal tenderness.     Comments: No G/R/R. No suprapubic or CVA tenderness. Negative Murphy's and McBurney's  Musculoskeletal: Normal range of motion.  Skin:    General: Skin is warm and dry.     Capillary Refill: Capillary refill takes less than 2  seconds.  Neurological:     Mental Status: He is alert.  Psychiatric:        Behavior: Behavior normal.      ED Treatments / Results  Labs (all labs ordered are listed, but only abnormal results are displayed) Labs Reviewed  COMPREHENSIVE METABOLIC PANEL - Abnormal; Notable for the following components:      Result Value   Glucose, Bld 105 (*)    All other  components within normal limits  URINALYSIS, ROUTINE W REFLEX MICROSCOPIC - Abnormal; Notable for the following components:   Color, Urine STRAW (*)    All other components within normal limits  GROUP A STREP BY PCR  NOVEL CORONAVIRUS, NAA (HOSPITAL ORDER, SEND-OUT TO REF LAB)  LIPASE, BLOOD  CBC    EKG None  Radiology No results found.  Procedures Procedures (including critical care time)  Medications Ordered in ED Medications  sodium chloride flush (NS) 0.9 % injection 3 mL (has no administration in time range)     Initial Impression / Assessment and Plan / ED Course  I have reviewed the triage vital signs and the nursing notes.  Pertinent labs & imaging results that were available during my care of the patient were reviewed by me and considered in my medical decision making (see chart for details). I have reviewed patient's EMR to obtain pertinent PMH to assist in MDM.  Based on history, exam abdominal discomfort likely from GERD versus gastritis.  He has postprandial abdominal discomfort, nausea, burning discomfort, regurgitation after eating, early satiety.  He takes daily ibuprofen.  Exam is benign.  He has no fever.  No abdominal tenderness.  No vomiting, hematemesis, diarrhea, melena, hematochezia.  No urinary symptoms.  His lab work today is reviewed by me and completely benign.  UA is negative.  I have low suspicion for acute life-threatening intra-abdominal/pelvic etiology such as pancreatitis, cholecystitis, appendicitis, pyelonephritis, kidney stone, SBO.  Discussed plan to discharge with omeprazole,  famotidine, diet changes, PCP follow-up for persistent symptoms in 2 weeks.  He takes daily probiotics and this typically can cause increased gas, diarrhea which could also be contributing to his abdominal bloating and discomfort.  Recommended he start to cut back on these and see if his symptoms improve.  Recommended avoidance of NSAIDs.  Return precautions discussed.  Endurance to his upper respiratory infection symptoms, high suspicion for viral illness, possibly COVID-19 given his possible exposures at work, sick family members at work and child that just are going to daycare.  Exam is benign.  Normal work of breathing.  No fever, tachypnea, tachycardia, hypoxemia.  Lungs are CTA B.  I do not think a chest x-ray is indicated today as there are no abnormal vital signs, consolidation signs on auscultation, increased work of breathing or other concerning features on his exam.  Doubt bacterial bronchitis, pneumonia.  Strep test is negative.  No leukocytosis.  Given reassuring physical exam, symptoms, will discharge with symptomatic treatment. Pt was evaluated in the context of the global COVID-19 pandemic.  We discussed patient's overall low risk profile to develop complications.  Given work, exposures COVID-19 swab obtained and pending.  Recommended PCP f/u in the next 2-3 days for persistent symptoms  for further guidance.  Pt was given home self-isolation, symptomatic treatment and return instructions.  Pt comfortable and agreeable with POC.   Final Clinical Impressions(s) / ED Diagnoses   Final diagnoses:  Left lateral abdominal pain  Suspected 2019 Novel Coronavirus Infection    ED Discharge Orders    None       Liberty HandyGibbons, Amorina Doerr J, PA-C 07/14/19 1853    Melene PlanFloyd, Dan, DO 07/16/19 1515

## 2019-07-14 NOTE — ED Triage Notes (Signed)
Pt with left lower abdominal pressure and swelling.  Sore throat, nasal congestion and taking Dayquil. Denies fevers or dysuria. Was tested for Covid-19 3 weeks ago negative.

## 2019-07-14 NOTE — Discharge Instructions (Signed)
You were seen in the ER for abdominal pain. Labs were normal. Urine was normal.   I suspect your symptoms may be from gastritis, acid reflux.  All of these are managed similarly.   We will treat this with anti-acid medicines.  Start taking over the counter omeprazole to 40 mg daily, take on an empty stomach first thing in the morning and wait 20-30 min before eating.  Add famotidine 20 mg in the AM and PM.  Use Tums as needed after meals.   Avoid irritating foods and liquids such as alcohol, greasy/fatty or acidic foods. Avoid ibuprofen and aspirin containing products.   Follow up with primary care doctor in 14 days for further management of this if it persists despite medicines and diet changes   Return to the ER for fever, chills, blood in vomit or stool, worsening localized abdominal pain to right upper or right lower abdomen, inability to tolerate fluids.  You were concerned about COVID symptoms.  Strep test was negative. COVID test is pending.  This could be COVID or any other viral infection.   Test results come back in 2-3 days but could be delayed for 5-7 days. Someone will call you to notify you of results.  You can check MyChart for results.   Treatment of your illness and symptoms will include self-isolation, monitoring of symptoms and supportive care with over-the-counter medicines.    Return to the ED if there is increased work of breathing, shortness of breath, inability to tolerate fluids, weakness, chest pain.  If your test results are POSITIVE, the following isolation requirements need to be met to return to work and resume essential activities: At least 14 days since symptom onset  72 hours of absence of fever without antifever medicine (ibuprofen, acetaminophen). A fever is temperature of 100.25F or greater. Improvement of respiratory symptoms  If your test is NEGATIVE, you may return to work and essential activities as long as your symptoms have improved and you do not  have a fever for a total of 3 days.  Call your job and notify them that your test result was negative to see if they will allow you to return to work.   Stay well-hydrated. Rest. You can use over the counter medications to help with symptoms: 600 mg ibuprofen (motrin, aleve, advil) or acetaminophen (tylenol) every 6 hours, around the clock to help with associated fevers, sore throat, headaches, generalized body aches and malaise.  Oxymetazoline (afrin) intranasal spray once daily for no more than 3 days to help with congestion, after 3 days you can switch to another over-the-counter nasal steroid spray such as fluticasone (flonase) Allergy medication (loratadine, cetirizine, etc) and phenylephrine (sudafed) help with nasal congestion, runny nose and postnasal drip.   Dextromethorphan (Delsym) to suppress dry cough. Frequent coughing is likely causing your chest wall pain Wash your hands often to prevent spread.    Infection Prevention Recommendations for Individuals Confirmed to have, or Being Evaluated for, or have symptoms of 2019 Novel Coronavirus (COVID-19) Infection Who Receive Care at Home  Individuals who are confirmed to have, or are being evaluated for, COVID-19 should follow the prevention steps below until a healthcare provider or local or state health department says they can return to normal activities.  Stay home except to get medical care You should restrict activities outside your home, except for getting medical care. Do not go to work, school, or public areas, and do not use public transportation or taxis.  Call ahead before visiting  your doctor Before your medical appointment, call the healthcare provider and tell them that you have, or are being evaluated for, COVID-19 infection. This will help the healthcare providers office take steps to keep other people from getting infected. Ask your healthcare provider to call the local or state health department.  Monitor your  symptoms Seek prompt medical attention if your illness is worsening (e.g., difficulty breathing). Before going to your medical appointment, call the healthcare provider and tell them that you have, or are being evaluated for, COVID-19 infection. Ask your healthcare provider to call the local or state health department.  Wear a facemask You should wear a facemask that covers your nose and mouth when you are in the same room with other people and when you visit a healthcare provider. People who live with or visit you should also wear a facemask while they are in the same room with you.  Separate yourself from other people in your home As much as possible, you should stay in a different room from other people in your home. Also, you should use a separate bathroom, if available.  Avoid sharing household items You should not share dishes, drinking glasses, cups, eating utensils, towels, bedding, or other items with other people in your home. After using these items, you should wash them thoroughly with soap and water.  Cover your coughs and sneezes Cover your mouth and nose with a tissue when you cough or sneeze, or you can cough or sneeze into your sleeve. Throw used tissues in a lined trash can, and immediately wash your hands with soap and water for at least 20 seconds or use an alcohol-based hand rub.  Wash your [facility] your hands often and thoroughly with soap and water for at least 20 seconds. You can use an alcohol-based hand sanitizer if soap and water are not available and if your hands are not visibly dirty. Avoid touching your eyes, nose, and mouth with unwashed hands.   Prevention Steps for Caregivers and Household Members of Individuals Confirmed to have, or Being Evaluated for, or have symptoms of 2019 Novel Coronavirus (COVID-19) Infection Being Cared for in the Home  If you live with, or provide care at home for, a person confirmed to have, or being evaluated for, COVID-19  infection please follow these guidelines to prevent infection:  Follow healthcare providers instructions Make sure that you understand and can help the patient follow any healthcare provider instructions for all care.  Provide for the patients basic needs You should help the patient with basic needs in the home and provide support for getting groceries, prescriptions, and other personal needs.  Monitor the patients symptoms If they are getting sicker, call his or her medical provider and tell them that the patient has, or is being evaluated for, COVID-19 infection. This will help the healthcare providers office take steps to keep other people from getting infected. Ask the healthcare provider to call the local or state health department.  Limit the number of people who have contact with the patient If possible, have only one caregiver for the patient. Other household members should stay in another home or place of residence. If this is not possible, they should stay in another room, or be separated from the patient as much as possible. Use a separate bathroom, if available. Restrict visitors who do not have an essential need to be in the home.  Keep older adults, very young children, and other sick people away from the patient Keep  older adults, very young children, and those who have compromised immune systems or chronic health conditions away from the patient. This includes people with chronic heart, lung, or kidney conditions, diabetes, and cancer.  Ensure good ventilation Make sure that shared spaces in the home have good air flow, such as from an air conditioner or an opened window, weather permitting.  Wash your hands often Wash your hands often and thoroughly with soap and water for at least 20 seconds. You can use an alcohol based hand sanitizer if soap and water are not available and if your hands are not visibly dirty. Avoid touching your eyes, nose, and mouth with unwashed  hands. Use disposable paper towels to dry your hands. If not available, use dedicated cloth towels and replace them when they become wet.  Wear a facemask and gloves Wear a disposable facemask at all times in the room and gloves when you touch or have contact with the patients blood, body fluids, and/or secretions or excretions, such as sweat, saliva, sputum, nasal mucus, vomit, urine, or feces.  Ensure the mask fits over your nose and mouth tightly, and do not touch it during use. Throw out disposable facemasks and gloves after using them. Do not reuse. Wash your hands immediately after removing your facemask and gloves. If your personal clothing becomes contaminated, carefully remove clothing and launder. Wash your hands after handling contaminated clothing. Place all used disposable facemasks, gloves, and other waste in a lined container before disposing them with other household waste. Remove gloves and wash your hands immediately after handling these items.  Do not share dishes, glasses, or other household items with the patient Avoid sharing household items. You should not share dishes, drinking glasses, cups, eating utensils, towels, bedding, or other items with a patient who is confirmed to have, or being evaluated for, COVID-19 infection. After the person uses these items, you should wash them thoroughly with soap and water.  Wash laundry thoroughly Immediately remove and wash clothes or bedding that have blood, body fluids, and/or secretions or excretions, such as sweat, saliva, sputum, nasal mucus, vomit, urine, or feces, on them. Wear gloves when handling laundry from the patient. Read and follow directions on labels of laundry or clothing items and detergent. In general, wash and dry with the warmest temperatures recommended on the label.  Clean all areas the individual has used often Clean all touchable surfaces, such as counters, tabletops, doorknobs, bathroom fixtures, toilets,  phones, keyboards, tablets, and bedside tables, every day. Also, clean any surfaces that may have blood, body fluids, and/or secretions or excretions on them. Wear gloves when cleaning surfaces the patient has come in contact with. Use a diluted bleach solution (e.g., dilute bleach with 1 part bleach and 10 parts water) or a household disinfectant with a label that says EPA-registered for coronaviruses. To make a bleach solution at home, add 1 tablespoon of bleach to 1 quart (4 cups) of water. For a larger supply, add  cup of bleach to 1 gallon (16 cups) of water. Read labels of cleaning products and follow recommendations provided on product labels. Labels contain instructions for safe and effective use of the cleaning product including precautions you should take when applying the product, such as wearing gloves or eye protection and making sure you have good ventilation during use of the product. Remove gloves and wash hands immediately after cleaning.  Monitor yourself for signs and symptoms of illness Caregivers and household members are considered close contacts, should monitor their  health, and will be asked to limit movement outside of the home to the extent possible. Follow the monitoring steps for close contacts listed on the symptom monitoring form.  ? If you have additional questions, contact your local health department or call the epidemiologist on call at [phone] (available 24/7). ? This guidance is subject to change. For the most up-to-date guidance from [facility], please refer to their website: YouBlogs.pl

## 2019-07-16 LAB — NOVEL CORONAVIRUS, NAA (HOSP ORDER, SEND-OUT TO REF LAB; TAT 18-24 HRS): SARS-CoV-2, NAA: NOT DETECTED

## 2019-07-31 ENCOUNTER — Ambulatory Visit: Payer: Self-pay | Admitting: Internal Medicine

## 2019-07-31 ENCOUNTER — Other Ambulatory Visit: Payer: Self-pay

## 2019-08-22 ENCOUNTER — Ambulatory Visit: Payer: Self-pay | Admitting: Internal Medicine

## 2019-11-18 ENCOUNTER — Ambulatory Visit: Payer: Self-pay

## 2019-12-23 ENCOUNTER — Encounter: Payer: Self-pay | Admitting: Family Medicine

## 2019-12-23 ENCOUNTER — Ambulatory Visit: Payer: No Typology Code available for payment source | Admitting: Family Medicine

## 2019-12-23 ENCOUNTER — Other Ambulatory Visit: Payer: Self-pay

## 2019-12-23 VITALS — BP 132/87 | HR 80 | Temp 98.3°F | Resp 14 | Ht 71.0 in | Wt 188.4 lb

## 2019-12-23 DIAGNOSIS — Z7689 Persons encountering health services in other specified circumstances: Secondary | ICD-10-CM

## 2019-12-23 DIAGNOSIS — Z716 Tobacco abuse counseling: Secondary | ICD-10-CM

## 2019-12-23 DIAGNOSIS — F1111 Opioid abuse, in remission: Secondary | ICD-10-CM

## 2019-12-23 DIAGNOSIS — F1510 Other stimulant abuse, uncomplicated: Secondary | ICD-10-CM

## 2019-12-23 DIAGNOSIS — G44229 Chronic tension-type headache, not intractable: Secondary | ICD-10-CM

## 2019-12-23 DIAGNOSIS — Z7189 Other specified counseling: Secondary | ICD-10-CM | POA: Diagnosis not present

## 2019-12-23 DIAGNOSIS — Z0000 Encounter for general adult medical examination without abnormal findings: Secondary | ICD-10-CM

## 2019-12-23 DIAGNOSIS — M791 Myalgia, unspecified site: Secondary | ICD-10-CM

## 2019-12-23 DIAGNOSIS — R5382 Chronic fatigue, unspecified: Secondary | ICD-10-CM

## 2019-12-23 DIAGNOSIS — F17200 Nicotine dependence, unspecified, uncomplicated: Secondary | ICD-10-CM

## 2019-12-23 DIAGNOSIS — L409 Psoriasis, unspecified: Secondary | ICD-10-CM

## 2019-12-23 MED ORDER — VARENICLINE TARTRATE 1 MG PO TABS: 1.0000 mg | ORAL_TABLET | Freq: Two times a day (BID) | ORAL | 1 refills | Status: AC

## 2019-12-23 MED ORDER — CHANTIX STARTING MONTH PAK 0.5 MG X 11 & 1 MG X 42 PO TABS: ORAL_TABLET | ORAL | 0 refills | Status: AC

## 2019-12-23 NOTE — Patient Instructions (Addendum)
Follow up in near future at your convenience for fasting blood work and physical exam same day.  Look into the Calm or Headspace App.  Please realize, EXERCISE IS MEDICINE!  -  [facility]) guidelines for exercise : If you are in good health, without any medical conditions, you should engage in 150-300 minutes of moderate intensity aerobic activity per week.  This means you should be huffing and puffing throughout your workout.   Engaging in regular exercise will improve brain function and memory, as well as improve mood, boost immune system and help with weight management.  As well as the other, more well-known effects of exercise such as decreasing blood sugar levels, decreasing blood pressure,  and decreasing bad cholesterol levels/ increasing good cholesterol levels.     -  The AHA strongly endorses consumption of a diet that contains a variety of foods from all the food categories with an emphasis on fruits and vegetables; fat-free and low-fat dairy products; cereal and grain products; legumes and nuts; and fish, poultry, and/or extra lean meats.    Excessive food intake, especially of foods high in saturated and trans fats, sugar, and salt, should be avoided.    Adequate water intake of roughly 1/2 of your weight in pounds, should equal the ounces of water per day you should drink.  So for instance, if you're 200 pounds, that would be 100 ounces of water per day.         Mediterranean Diet  Why follow it? Research shows. . Those who follow the Mediterranean diet have a reduced risk of heart disease  . The diet is associated with a reduced incidence of Parkinson's and Alzheimer's diseases . People following the diet may have longer life expectancies and lower rates of chronic diseases  . The Dietary Guidelines for Americans recommends the Mediterranean diet as an eating plan to promote health and prevent disease  What Is the Mediterranean Diet?  . Healthy eating plan  based on typical foods and recipes of Mediterranean-style cooking . The diet is primarily a plant based diet; these foods should make up a majority of meals   Starches - Plant based foods should make up a majority of meals - They are an important sources of vitamins, minerals, energy, antioxidants, and fiber - Choose whole grains, foods high in fiber and minimally processed items  - Typical grain sources include wheat, oats, barley, corn, brown rice, bulgar, farro, millet, polenta, couscous  - Various types of beans include chickpeas, lentils, fava beans, black beans, white beans   Fruits  Veggies - Large quantities of antioxidant rich fruits & veggies; 6 or more servings  - Vegetables can be eaten raw or lightly drizzled with oil and cooked  - Vegetables common to the traditional Mediterranean Diet include: artichokes, arugula, beets, broccoli, brussel sprouts, cabbage, carrots, celery, collard greens, cucumbers, eggplant, kale, leeks, lemons, lettuce, mushrooms, okra, onions, peas, peppers, potatoes, pumpkin, radishes, rutabaga, shallots, spinach, sweet potatoes, turnips, zucchini - Fruits common to the Mediterranean Diet include: apples, apricots, avocados, cherries, clementines, dates, figs, grapefruits, grapes, melons, nectarines, oranges, peaches, pears, pomegranates, strawberries, tangerines  Fats - Replace butter and margarine with healthy oils, such as olive oil, canola oil, and tahini  - Limit nuts to no more than a handful a day  - Nuts include walnuts, almonds, pecans, pistachios, pine nuts  - Limit or avoid candied, honey roasted or heavily salted nuts - Olives are central to the Mediterranean diet - can  be eaten whole or used in a variety of dishes   Meats Protein - Limiting red meat: no more than a few times a month - When eating red meat: choose lean cuts and keep the portion to the size of deck of cards - Eggs: approx. 0 to 4 times a week  - Fish and lean poultry: at least 2 a  week  - Healthy protein sources include, chicken, [place], lean beef, lamb - Increase intake of seafood such as tuna, salmon, trout, mackerel, shrimp, scallops - Avoid or limit high fat processed meats such as sausage and bacon  Dairy - Include moderate amounts of low fat dairy products  - Focus on healthy dairy such as fat free yogurt, skim milk, low or reduced fat cheese - Limit dairy products higher in fat such as whole or 2% milk, cheese, ice cream  Alcohol - Moderate amounts of red wine is ok  - No more than 5 oz daily for women (all ages) and men older than age 465  - No more than 10 oz of wine daily for men younger than 7965  Other - Limit sweets and other desserts  - Use herbs and spices instead of salt to flavor foods  - Herbs and spices common to the traditional Mediterranean Diet include: basil, bay leaves, chives, cloves, cumin, fennel, garlic, lavender, marjoram, mint, oregano, parsley, pepper, rosemary, sage, savory, sumac, tarragon, thyme   It's not just a diet, it's a lifestyle:  . The Mediterranean diet includes lifestyle factors typical of those in the region  . Foods, drinks and meals are best eaten with others and savored . Daily physical activity is important for overall good health . This could be strenuous exercise like running and aerobics . This could also be more leisurely activities such as walking, housework, yard-work, or taking the stairs . Moderation is the key; a balanced and healthy diet accommodates most foods and drinks . Consider portion sizes and frequency of consumption of certain foods   Meal Ideas & Options:  . Breakfast:  o Whole wheat toast or whole wheat English muffins with peanut butter & hard boiled egg o Steel cut oats topped with apples & cinnamon and skim milk  o Fresh fruit: banana, strawberries, melon, berries, peaches  o Smoothies: strawberries, bananas, greek yogurt, peanut butter o Low fat greek yogurt with blueberries and granola  o Egg  white omelet with spinach and mushrooms o Breakfast couscous: whole wheat couscous, apricots, skim milk, cranberries  . Sandwiches:  o Hummus and grilled vegetables (peppers, zucchini, squash) on whole wheat bread   o Grilled chicken on whole wheat pita with lettuce, tomatoes, cucumbers or tzatziki  o Tuna salad on whole wheat bread: tuna salad made with greek yogurt, olives, red peppers, capers, green onions o Garlic rosemary lamb pita: lamb sauted with garlic, rosemary, salt & pepper; add lettuce, cucumber, greek yogurt to pita - flavor with lemon juice and black pepper  . Seafood:  o Mediterranean grilled salmon, seasoned with garlic, basil, parsley, lemon juice and black pepper o Shrimp, lemon, and spinach whole-grain pasta salad made with low fat greek yogurt  o Seared scallops with lemon orzo  o Seared tuna steaks seasoned salt, pepper, coriander topped with tomato mixture of olives, tomatoes, olive oil, minced garlic, parsley, green onions and cappers  . Meats:  o Herbed greek chicken salad with kalamata olives, cucumber, feta  o Red bell peppers stuffed with spinach, bulgur, lean ground beef (or lentils) &  topped with feta   o Kebabs: skewers of chicken, tomatoes, onions, zucchini, squash  o [place] burgers: made with red onions, mint, dill, lemon juice, feta cheese topped with roasted red peppers . Vegetarian o Cucumber salad: cucumbers, artichoke hearts, celery, red onion, feta cheese, tossed in olive oil & lemon juice  o Hummus and whole grain pita points with a greek salad (lettuce, tomato, feta, olives, cucumbers, red onion) o Lentil soup with celery, carrots made with vegetable broth, garlic, salt and pepper  o Tabouli salad: parsley, bulgur, mint, scallions, cucumbers, tomato, radishes, lemon juice, olive oil, salt and pepper.     Please think seriously about quitting smoking!  This is very important for your health and well being.    Please let us know in the future if you  are interested and ready to quit.   You can also call 1-800-QUIT-NOW 217-512-1881) for free smoking cessation counseling and support.     Also, please go online to www.heart.org (the American Heart Association website) and search "quit smoking ".     Or try the centers for disease control website at: https://www.schmidt.com/  Or, go to the "national cancer institute" web site of NIH:  http://benson.com/   There is a lot of great information on these websites for you to look over.      Want to Quit Smoking? FDA-Approved Products Can Help  Quitting smoking can be hard, but it is possible. In fact, every time you put out a cigarette is a new chance to try quitting again, according to the [place] Food and Drug Administration's newest tobacco education campaign, "Every Try Counts."   If you want to quit--almost 70 percent of adult smokers say they do--you may want to use a "smoking cessation" product proven to help. Data has shown that using FDA-approved cessation medicine can double your chance of quitting successfully.  Some products contain nicotine as an active ingredient and others do not. These products include over-the-counter (OTC) options like skin patches, lozenges, and gum, as well as prescription medicines.  Smoking cessation products are intended to help you quit smoking. They are regulated through the [facility], which ensures that the products are safe and effective and that their benefits outweigh any known associated risks.  The Benefits of Quitting Smoking No matter how much you smoke--or for how long--quitting will benefit you.  Not only will you lower your risk of getting various cancers, including lung cancer, you'll also reduce your chances of having heart disease, a stroke, emphysema, and other serious diseases. Quitting also will lower the risk of  heart disease and lung cancer in nonsmokers who otherwise would be exposed to your secondhand smoke.  Although there are benefits to quitting at any age, it is important to quit as soon as possible so your body can begin to heal from the damage caused by smoking. For instance, 12 hours after you quit smoking the carbon monoxide level in your blood drops to normal. Carbon monoxide is harmful because it displaces oxygen in the blood and deprives your heart, brain, and other vital organs of oxygen.  What To Know About Smoking Cessation Products Understanding how smoking cessation products work--and what side effects they may cause--can help you determine which product may be best for you.  If you're considering one of these products, reading labels and talking to your pharmacist and other health care providers are good first steps to take.  You also can check the FDA's website  for more information on each product at Drugs@FDA , where you can search for each product by name.  And remember to weigh each product's benefits and risks, among other considerations.  About Nicotine Replacement Therapy (NRT) Nicotine is the substance primarily responsible for causing addiction to tobacco products. Tobacco users who are addicted to nicotine are used to having nicotine in their bodies.  As you try to quit smoking, you may have symptoms of nicotine withdrawal. When you quit, this withdrawal may cause symptoms like cravings, or urges, to smoke; depression; trouble sleeping; irritability; anxiety; and increased appetite.  Nicotine withdrawal can discourage some smokers from continuing with a quit attempt. But the FDA has approved several smoking cessation products designed to help users gradually withdraw from smoking (that is, "wean" themselves from smoking) by using specific amounts of nicotine that decrease over time. This type of product is called a "nicotine replacement therapy" or NRT. It supplies nicotine in  controlled amounts while sparing you from other chemicals found in tobacco products.  NRTs are available over the counter and by prescription. You should generally use them only for a short time to help you manage nicotine cravings and withdrawal. However, the FDA recognizes that some people may need to use these products longer to stay smoke-free. Talk to your health care provider to determine the best course of treatment for you.  Over-the-counter NRTs are approved for sale to people age 41 and older. They are available under various brand names and sometimes as generic products. They include:  - Skin patches (also called "transdermal nicotine patches"). These patches are placed on the skin, similar to how you would apply an adhesive bandage. - Chewing gum (also called "nicotine gum"). This gum must be chewed according to the labeled instructions to be effective. - Lozenges (also called "nicotine lozenges"). You use these products by dissolving them in your mouth. For over-the-counter products, it's important to follow the instructions on the Drug Facts Label (DFL) and to read the enclosed User's Guide for complete directions and other important information. Ask your health care provider if you have questions.  Currently, prescription nicotine replacement therapy is available only under the brand name Nicotrol, and is available both as a nasal spray and an oral inhaler. The products are FDA-approved only for use by adults.  If you are under age 42 and want to quit smoking, talk to a health care professional about whether you should use nicotine replacement therapies.  Important Advice for People Considering Nicotine Replacement Therapy Women who are pregnant or breastfeeding should talk to their health care providers and use nicotine replacement products only if the health care providers approve.  Also talk to your health care provider before using these products if you have:  diabetes, heart  disease, asthma, or stomach ulcers; had a recent heart attack; high blood pressure that is not controlled with medicine; a history of irregular heartbeat; or been prescribed medication to help you quit smoking. If you take prescription medication for depression or asthma, tell your health care provider if you are quitting smoking because he or she may need to change your prescription dose.  Stop using a nicotine replacement product and call your health care professional if you have any of the following symptoms: nausea; dizziness; weakness; vomiting; fast or irregular heartbeat; mouth problems with the lozenge or gum; or redness or swelling of the skin around the patch that does not go away.  About Prescription Cessation Medicines Without Nicotine  The FDA has approved  two smoking cessation products that do not contain nicotine. They are Chantix (varenicline tartrate) and Zyban (buproprion hydrochloride). Both are available in tablet form and by prescription only.  Chantix acts at sites in the brain affected by nicotine by reducing the rewarding effects of nicotine. The precise way that Zyban helps with smoking cessation is unknown.  As with other prescription products, the FDA has evaluated these medicines and found that the benefits outweigh the risks. For users taking these products, risks include changes in behavior, depressed mood, hostility, aggression, and suicidal thoughts or actions.  The most common side effects of Chantix include nausea; constipation; gas; vomiting; and trouble sleeping or vivid, unusual, or strange dreams. Chantix also may change how you react to alcohol, so talk to your health care provider about your drinking habits (if you drink alcohol) and whether these habits need to change. Chantix is not recommended for people under the age of 23.  The most commonly observed side effects consistently associated with the use of Zyban are dry mouth and insomnia.  Because  Zyban contains the same active ingredient as the antidepressant Wellbutrin (bupropion), the FDA encourages people who use Zyban--and those who are considering it--to talk to their health care providers about the risks of treatment with antidepressant medicines. Zyban has not been studied in children under the age of 23 and is not approved for use in children and teenagers.  Note: If your health care provider prescribes Chantix or Zyban, please read the product's patient medication guide in its entirety. These guides offer important information on side effects, risks, warnings, product ingredients, and what you should talk about with your health care provider before taking the products.  Finally, if you ever have any side effects related to any smoking cessation products, or have any other problems related to your treatment, the FDA would like to hear from you. Please consider making a voluntary and confidential report to the FDA's MedWatch program.  Updated: October 30, 2016

## 2019-12-23 NOTE — Progress Notes (Signed)
New patient office visit note:  Impression and Recommendations:    1. Encounter to establish care with new doctor   2. Tobacco dependence   3. Tobacco abuse counseling   4. 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 Psoriasis-   arthritis and dermatitis per pt--> sees [facility] dermatology   8. Chronic fatigue- likely due to caffeine overuse/withdrawal   9. Chronic tension-type headache, not intractable-every morning due to caffeine withdrawal   10. Myalgia   11. Encounter for wellness examination in adult      Encounter to Establish Care w/ New Doctor - Extensive discussion held with patient regarding establishing as a new patient.  Discussed policies and practices here at the clinic, and answered all questions about care team and health management during appointment.  - Discussed need for patient to continue to obtain management and screenings with all established specialists.  Educated patient at length about the critical importance of keeping health maintenance up to date.  - Participated in lengthy conversation and all questions were answered.   H/o narcotic abuse/ overuse: - stable at current; not an issue   Psoriasis -  sx well controlled at current - Patient will continue to follow up with specialist as established. - Will continue to monitor alongside [facility].   -  Significant Caffeine Abuse - Chronic Fatigue due to poor health habits/ caffeine abuse,  -  Chronic Headache likely due to Caffeine Withdrawal - Reviewed symptoms of caffeine overuse during appointment today, including increased fatigue. - Encouraged patient to reduce and ideally discontinue use of caffeine. - Will continue to monitor.   Current Smoker, Tobacco Dependence, Tobacco Cessation Counseling - Extensive education provided to patient today.  - Told pt to think seriously about quitting smoking!  Told pt  it is very important for his health and well being.   - Smoking cessation instruction/ counseling given of 10 minutes:  counseled patient on the dangers of tobacco use and reviewed strategies to maximize success- go online to chantix or AHA.  - Discussed with patient that there are multiple treatments to aid in quitting smoking, however I explained none will work unless pt really wants to quit.  - Reviewed the "spokes of the wheel" of mood and health management.  Stressed the importance of ongoing prudent habits, including regular exercise, appropriate sleep hygiene, healthful dietary habits, and prayer/meditation to relax.  - Chantix prescribed to patient today.  See med list. - Encouraged patient to go online to Chantix.com and research the medication.  - Discussed need to associate the urge to smoke in his brain with new, healthier habits.  - Told to call 1-800-QUIT-NOW (704)373-5978) for free smoking cessation counseling and support, or pt can go online to www.heart.org - the American Heart Association website and search "quit smoking."  - Will continue to closely monitor.   Health Counseling & Preventative Maintenance - Advised patient to continue working toward exercising to improve overall mental, physical, and emotional health.    - Encouraged patient to engage in daily physical activity, especially a formal exercise routine.  Recommended that the patient eventually strive for at least 150 minutes of moderate cardiovascular activity per week according to guidelines established by the [facility].   - Healthy dietary habits encouraged, including low-carb, and high amounts of lean protein in diet.   - Patient should also consume adequate amounts of water.  - Health  counseling performed.  All questions answered.   Education and routine counseling performed. Handouts provided.   Orders Placed This Encounter  Procedures   CBC with Differential/Platelet   Comprehensive metabolic  panel   Lipid panel   Testosterone, Free, Total, SHBG   Hemoglobin A1c   TSH   T4, free   VITAMIN D 25 Hydroxy (Vit-D Deficiency, Fractures)   Magnesium    Meds ordered this encounter  Medications   varenicline (CHANTIX STARTING MONTH PAK) 0.5 MG X 11 & 1 MG X 42 tablet    Sig: Take one 0.54m tablet by mouth once daily for 3 days, then increase to one 0.519mtablet twice daily for 3 days, then increase to one 86m74mablet twice daily.    Dispense:  53 tablet    Refill:  0   varenicline (CHANTIX) 1 MG tablet    Sig: Take 1 tablet (1 mg total) by mouth 2 (two) times daily.    Dispense:  60 tablet    Refill:  1    Gross side effects, risk and benefits, and alternatives of medications discussed with patient.  Patient is aware that all medications have potential side effects and we are unable to predict every side effect or drug-drug interaction that may occur.  Expresses verbal understanding and consents to current therapy plan and treatment regimen.    Please see AVS handed out to patient at the end of our visit for further patient instructions/ counseling done pertaining to today's office visit.    Note:  This document was prepared using Dragon voice recognition software and may include unintentional dictation errors.  This document serves as a record of services personally performed by DebMellody DanceO. It was created on her behalf by KatToni Amend trained medical scribe. The creation of this record is based on the scribe's personal observations and the provider's statements to them.   This case required medical decision making of at least moderate complexity. The above documentation from KatToni Amendedical scribe, has been reviewed by DebMarjory SneddonD.O.   ---------------------------------------------------------------------------------------------------------------------------------------------------------------------------------------------  Subjective:     I, Dustin Wright serving as scribe for Dr. DebMellody Dance  Chief complaint:   Chief Complaint  Patient presents with   New Patient (Initial Visit)     HPI: ChrWACO FOERSTER a pleasant 38 22o. male [facility] medical history with me and establish care.   I asked the patient to review their chronic problem list with me to ensure everything was updated and accurate.    All recent office visits with other providers, any medical records that patient brought in etc  - I reviewed today.     We asked pt to get us [place] [facility]/ [facility] that they had seen within the past 3-5 years- if they are in private practice and/or do not work [facility].  Told them to call their specialists to clarify this if they are not sure.     Reason for Establishing Care: Notes he doesn't have a PCP, never had one and has typically gone to the ER whenever something was going on.  Notes his wife wanted him to have a physical.   Social History  He works on heavy equipment.  Tobacco Use Has smoked 1 ppd for 22 years, since about age [age] tried to  quit "countless" times, "maybe twenty times." The most successful time, he used patches for two weeks, then tried to wean himself off of the patches after 2.5 weeks, and says "it didn't work."   Alcohol Use Does not drink alcohol.  Drug Use Notes "I was young and dumb." Had a history of opioid addiction. - tries to avoid pain meds   Caffeine Use & OTC Testosterone Booster Notes he drinks Monster energy drinks (C4?), about two large ones per day, along with a  testosterone booster over the counter from [employer].   -    States "I feel so fatigued, and I don't know that I have low testosterone, but I take it anyway because I use it for lifting wts/ working out."   Notes the testosterone booster is supposed to help improve libido as well but that's not as issue, but his main concern is the sluggishness/ fatigue.  "That's why I try to work out as much as I can."  Says if he doesn't work out, he feels even more sluggish and worse.  -  Only lifts wts and does VERY little Cardio  - Says "I think that [the energy drinks are] the reason I get headaches every day." - Notes he takes four ibuprofen every morning because he starts off each day with a mild headache.   Family History Notes his dad was an alcoholic, but he never met him- unknown hx otherwise.  Grandmother passed of cancer within 2 months; says "it took her really quick, not really sure what kind of cancer it was."  No known history of high blood pressure, cholesterol, diabetes or heart attacks.    Past Medical History Notes he had spinal meningitis as a very young child.  - History of Back Injury States he hurt his back a long time ago, but notes now when he works out, pain no longer occurs. If he is not working out, it can act up from time to time.  - History of Opioid Addiction Had an addiction problem with opioids in the past, and notes "I never want to be prescribed those again."  - Current Smoker, Cigarettes Has smoked 1 ppd for 21 years, since about age [age]. Has tried to quit "countless" times, "maybe twenty times." The most successful time, he used patches for two weeks, then tried to wean himself off of the patches after 2.5 weeks, and says "it didn't work."  - I just smoked again.   Notes "I smoke, and it's messing with my lungs."  He wants to quit, but states he has tried to quit about twenty times.  He is still smoking today despite currently wearing a patch.  Says he truly  wants to quit because he can't stand the smell of it, the taste of it, and can't stand the cost of it.  His father in law quit smoking with Chantix, and his brother-in-law also quit using Chantix.  Notes "I don't know much about Chantix, but I know that they quit after 30-40 years of smoking" etc.  Notes he knows that his grandfather used to replace the urge to smoke with a peppermint.  - Mood:  Notes his main drive is the fact that when he doesn't have a cigarette, he often gets short-tempered and snappy, and doesn't want his wife and kids to suffer from his anger and these attitude shifts.  Says whenever he doesn't smoke a cigarette, he begins to feel overwhelmed, like "waves crashing over me," and gets short and  snappy with people.  He has not tried deep breathing or meditation in the past.     - Psoriatic Arthritis and Psoriatic Dermatitis Has had arthritis his whole life.   - Didn't notice it much when he was a teen, but notes the older he gets, the more his knees hurt when he's crouched down or turning wrenches.  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  Has seen Dr. Ubaldo Glassing at [facility] in the past; returns this Thursday for additional follow-up.  Was told to go off of the [place] and return for evaluation.   - Nephrolithiasis Notes he has kidney stones and one breaks loose every couple of years.  Says "that's the worst pain I've ever experienced in my life."   [facility]    Wt Readings from Last 3 Encounters:  12/23/19 188 lb 6.4 oz (85.5 kg)  06/29/18 160 lb (72.6 kg)  04/12/17 153 lb (69.4 kg)   BP Readings from Last 3 Encounters:  12/23/19 132/87  07/14/19 128/78  07/13/19 123/82   Pulse Readings from Last 3 Encounters:  12/23/19 80  07/14/19 83  07/13/19 73   BMI Readings from Last 3 Encounters:  12/23/19 26.28 kg/m  06/29/18 22.32 kg/m  04/12/17 21.34 kg/m    Patient Care Team     Relationship Specialty Notifications Start End  Mellody Dance, DO PCP - General Family Medicine  12/23/19   Rolm Bookbinder, MD Consulting Physician Dermatology  12/27/19    Comment: psoriasis  Pa, [facility]    12/27/19     Patient Active Problem List   Diagnosis Date Noted   Tobacco dependence 04/12/2017   Psoriasis 04/04/2016   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[place]) 12/23/2019   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[place]) 12/23/2019   Chronic fatigue- likely due to caffeine overuse/withdrawal 12/23/2019   Chronic tension-type headache, not intractable-every morning due to caffeine withdrawal 12/23/2019   Dental cavity 04/12/2017   Chronic dental pain 04/04/2016   Right ureteral stone 04/04/2016   History of ADHD 04/04/2016       As reported by pt:  Past Medical History:  Diagnosis Date   ADHD (attention deficit hyperactivity disorder)    per report, no records, reports taking adderall in the past     Arthritis    Chronic lower back pain    Kidney stone      Past Surgical History:  Procedure Laterality Date   HERNIA REPAIR       Family History  Problem Relation Age of Onset   Hypertension Mother      Social History   Substance and Sexual Activity  Drug Use No   Frequency: 3.0 times per week   Comment: opiods-clean for 4 month     Social History   Substance and Sexual Activity  Alcohol Use No     Social History   Tobacco Use  Smoking Status Current Every Day Smoker   Packs/day: 1.00   Years: 11.00   Pack years: 11.00   Types: Cigarettes  Smokeless Tobacco Current User     Current Meds  Medication Sig   Misc Natural Products (GINKOGIN PO) Take by mouth.   Multiple Vitamin (MULTIVITAMIN) capsule Take 1 capsule by mouth daily.   OVER THE COUNTER MEDICATION Test 180 boost take 4 tabs daily    Allergies: Patient has no known allergies.   Review of Systems  Constitutional: Negative for chills, diaphoresis, fever,  malaise/fatigue and weight  loss.  HENT: Negative for congestion, sore throat and tinnitus.   Eyes: Negative for blurred vision, double vision and photophobia.       Reports change in vision.  Respiratory: Negative for cough and wheezing.   Cardiovascular: Negative for chest pain and palpitations.  Gastrointestinal: Negative for blood in stool, diarrhea, nausea and vomiting.  Genitourinary: Negative for dysuria, frequency and urgency.  Musculoskeletal: Positive for joint pain (chronic, arthritis) and myalgias (chronic, arthritis).  Skin: Positive for rash (chronic, psoriasis). Negative for itching.  Neurological: Positive for headaches (chronic). Negative for dizziness, focal weakness and weakness.  Endo/Heme/Allergies: Negative for environmental allergies and polydipsia. Does not bruise/bleed easily.  Psychiatric/Behavioral: Negative for depression and memory loss. The patient is not nervous/anxious and does not have insomnia.         Objective:   Blood pressure 132/87, pulse 80, temperature 98.3 F (36.8 C), temperature source Oral, resp. rate 14, height _0  (1.803 m), weight 188 lb 6.4 oz (85.5 kg), SpO2 97 %. Body mass index is 26.28 kg/m. General: Well Developed, well nourished, and in no acute distress.  Neuro: Alert and oriented x3, extra-ocular muscles intact, sensation grossly intact.  HEENT:[place]/AT, PERRLA, neck supple, No carotid bruits Skin: no gross rashes  Cardiac: Regular rate and rhythm Respiratory: Essentially clear to auscultation bilaterally. Not using accessory muscles, speaking in full sentences.  Abdominal: not grossly distended Musculoskeletal: Ambulates w/o diff, FROM * 4 ext.  Vasc: less 2 sec cap RF, warm and pink  Psych:  No HI/SI, judgement and insight good, Euthymic mood. Full Affect.    No results found for this or any previous visit (from the past 2160 hour(s)).

## 2020-02-09 ENCOUNTER — Encounter: Payer: No Typology Code available for payment source | Admitting: Family Medicine

## 2020-05-25 ENCOUNTER — Encounter (HOSPITAL_COMMUNITY): Payer: Self-pay

## 2020-05-25 ENCOUNTER — Other Ambulatory Visit: Payer: Self-pay

## 2020-05-25 ENCOUNTER — Ambulatory Visit (INDEPENDENT_AMBULATORY_CARE_PROVIDER_SITE_OTHER): Payer: No Typology Code available for payment source

## 2020-05-25 ENCOUNTER — Ambulatory Visit (HOSPITAL_COMMUNITY)
Admission: EM | Admit: 2020-05-25 | Discharge: 2020-05-25 | Disposition: A | Discharge status: Home / Self Care | Payer: No Typology Code available for payment source | Attending: Family Medicine | Admitting: Family Medicine

## 2020-05-25 DIAGNOSIS — Z87891 Personal history of nicotine dependence: Secondary | ICD-10-CM | POA: Diagnosis not present

## 2020-05-25 DIAGNOSIS — R0789 Other chest pain: Secondary | ICD-10-CM | POA: Diagnosis not present

## 2020-05-25 DIAGNOSIS — R5383 Other fatigue: Secondary | ICD-10-CM | POA: Diagnosis present

## 2020-05-25 DIAGNOSIS — J029 Acute pharyngitis, unspecified: Secondary | ICD-10-CM | POA: Insufficient documentation

## 2020-05-25 LAB — POCT URINALYSIS DIP (DEVICE)
Bilirubin Urine: NEGATIVE
Glucose, UA: NEGATIVE mg/dL
Hgb urine dipstick: NEGATIVE
Ketones, ur: NEGATIVE mg/dL
Leukocytes,Ua: NEGATIVE
Nitrite: NEGATIVE
Protein, ur: NEGATIVE mg/dL
Specific Gravity, Urine: 1.025 (ref 1.005–1.030)
Urobilinogen, UA: 0.2 mg/dL (ref 0.0–1.0)
pH: 5.5 (ref 5.0–8.0)

## 2020-05-25 LAB — COMPREHENSIVE METABOLIC PANEL
ALT: 41 U/L (ref 0–44)
AST: 28 U/L (ref 15–41)
Albumin: 4.2 g/dL (ref 3.5–5.0)
Alkaline Phosphatase: 77 U/L (ref 38–126)
Anion gap: 9 (ref 5–15)
BUN: 7 mg/dL (ref 6–20)
CO2: 26 mmol/L (ref 22–32)
Calcium: 9.7 mg/dL (ref 8.9–10.3)
Chloride: 105 mmol/L (ref 98–111)
Creatinine, Ser: 0.9 mg/dL (ref 0.61–1.24)
GFR calc Af Amer: >60 mL/min (ref >60)
GFR calc non Af Amer: >60 mL/min (ref >60)
Glucose, Bld: 102 mg/dL — ABNORMAL HIGH (ref 70–99)
Potassium: 4.1 mmol/L (ref 3.5–5.1)
Sodium: 140 mmol/L (ref 135–145)
Total Bilirubin: 0.8 mg/dL (ref 0.3–1.2)
Total Protein: 7.2 g/dL (ref 6.5–8.1)

## 2020-05-25 LAB — CBC WITH DIFFERENTIAL/PLATELET
Abs Immature Granulocytes: 0.03 10*3/uL (ref 0.00–0.07)
Basophils Absolute: 0 10*3/uL (ref 0.0–0.1)
Basophils Relative: 1 %
Eosinophils Absolute: 0.1 10*3/uL (ref 0.0–0.5)
Eosinophils Relative: 1 %
HCT: 44.4 % (ref 39.0–52.0)
Hemoglobin: 15.4 g/dL (ref 13.0–17.0)
Immature Granulocytes: 1 %
Lymphocytes Relative: 21 %
Lymphs Abs: 1.3 10*3/uL (ref 0.7–4.0)
MCH: 29.5 pg (ref 26.0–34.0)
MCHC: 34.7 g/dL (ref 30.0–36.0)
MCV: 85.1 fL (ref 80.0–100.0)
Monocytes Absolute: 0.5 10*3/uL (ref 0.1–1.0)
Monocytes Relative: 9 %
Neutro Abs: 4.2 10*3/uL (ref 1.7–7.7)
Neutrophils Relative %: 67 %
Platelets: 302 10*3/uL (ref 150–400)
RBC: 5.22 MIL/uL (ref 4.22–5.81)
RDW: 12 % (ref 11.5–15.5)
WBC: 6.2 10*3/uL (ref 4.0–10.5)
nRBC: 0 % (ref 0.0–0.2)

## 2020-05-25 LAB — VITAMIN D 25 HYDROXY (VIT D DEFICIENCY, FRACTURES): Vit D, 25-Hydroxy: 33.83 ng/mL (ref 30–100)

## 2020-05-25 LAB — TSH: TSH: 1.527 u[IU]/mL (ref 0.350–4.500)

## 2020-05-25 MED ORDER — CLOTRIMAZOLE 10 MG MT TROC: 10.0000 mg | Freq: Every day | OROMUCOSAL | 0 refills | Status: AC

## 2020-05-25 NOTE — Discharge Instructions (Signed)
Fatigue Chest Xray normal Urine normal Blood work pending to check electrlytes, blood levels, thyroid and vitamin D  Continue to try and drink plenty of fluids  Sore thorat Overall throat appears normal Clotrimazole troches 5 times daily to cover for thrush May also try honey/hot tea, daily zyrtec/claritin, tylenol and ibuprofen  Follow up with primary care

## 2020-05-25 NOTE — ED Triage Notes (Signed)
Pt c/o general malaise/fatigue for approx 2 months. Pt states he was taking TALTS (immune suppressive therapy psoriasis) and his dermatologist had him d/c his injections 2/2 pt c/o sluggishness.   Pt states he developed thrush in his mouth, and "swelling sensation to throat" approx 3 weeks ago that did not improve with Miracle Mouthwash for 1 week, but did resolve with PO Rx (diflucan) that was given to him by dermatology. Pt states when the 2 weeks of Rx were complete, thrush sore has started to return and sensation of difficulty swallowing returned.  Denies fever, chills, congestion, runny nose, cough, SOB.

## 2020-05-26 NOTE — ED Provider Notes (Signed)
[facility]    CSN: [id number] Arrival date & time: 05/25/20  1001      History   Chief Complaint Chief Complaint  Patient presents with   Sore Throat   Fatigue    HPI Dustin Wright is a 39 y.o. male presenting today for evaluation of fatigue and sore throat.  Patient reports over the past 2 months he has had a lot of fatigue and low energy.  He feels as if he is not able to have the energy to do things with his family as he would typically do.  He has felt some shortness of breath and discomfort in his chest at times.  Does report prior tobacco use, but recently used Chantix and has quit for a few months.  Denies recent illness.  Denies abdominal pain nausea or vomiting.  He denies any unintentional weight loss.  He feels he has had some weight gain as he has been less active than normal.  Denies leg pain or leg swelling.  Denies changes in bowels.  Denies blood in stool.  He does report recent treatment for thrush which was diagnosed at dermatology.  Took Diflucan which did improve her symptoms, but reports over the past few days has had increasing sore throat which feels similar to what he was feeling before with thrush.  Feels as if his throat feels slightly swollen.  Has had some mild associated congestion and cough.  Denies any medicines.  Denies change in activity or sleep.  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 Authorizing Provider  BIOTIN PO Take 1 capsule by mouth daily.    Yes [provider]  Misc Natural Products (GINKOGIN PO) Take by mouth.   Yes [provider]  Multiple Vitamin (MULTIVITAMIN) capsule Take 1 capsule by mouth daily.   Yes [provider]  Omega-3 Fatty Acids (FISH OIL PO) Take 1,200 mg by mouth daily.    Yes [provider]  OVER THE COUNTER MEDICATION Test 180 boost take 4 tabs daily   Yes [provider]  clotrimazole (MYCELEX) 10 MG troche Take 1 tablet (10 mg total) by mouth 5 (five) times daily. 05/25/20   Kenyon Eshleman C, PA-C  varenicline (CHANTIX STARTING MONTH PAK) 0.5 MG X 11 & 1 MG X 42 tablet Take one 0.5mg  tablet by mouth once daily for 3 days, then increase to one 0.5mg  tablet twice daily for 3 days, then increase to one 1mg  tablet twice daily. 12/23/19   02/20/20, DO  varenicline (CHANTIX)  1 MG tablet Take 1 tablet (1 mg total) by mouth 2 (two) times daily. 12/23/19   Thomasene Lot, DO    Family History Family History  Problem Relation Age of Onset   Hypertension Mother     Social History Social History   Tobacco Use   Smoking status: Current Every Day Smoker    Packs/day: 1.00    Years: 11.00    Pack years: 11.00    Types: Cigarettes   Smokeless tobacco: Current User  Vaping Use   Vaping Use: Every day  Substance Use Topics   Alcohol use: No   Drug use: No    Frequency: 3.0 times per week    Comment: opiods-clean for 4 month     Allergies   Patient has no known allergies.   Review of Systems Review of Systems  Constitutional: Positive for fatigue.  Negative for activity change, appetite change, chills and fever.  HENT: Positive for congestion, rhinorrhea and sore throat. Negative for ear pain, sinus pressure and trouble swallowing.   Eyes: Negative for photophobia, pain, discharge, redness and visual disturbance.  Respiratory: Negative for cough, chest tightness and shortness of breath.   Cardiovascular: Negative for chest pain.  Gastrointestinal: Negative for abdominal pain, diarrhea, nausea and vomiting.  Genitourinary: Negative for decreased urine volume and hematuria.  Musculoskeletal: Negative for myalgias, neck pain and neck stiffness.  Skin: Negative for rash.  Neurological: Negative for dizziness, syncope, facial asymmetry, speech difficulty, weakness, light-headedness, numbness and headaches.     Physical Exam Triage Vital Signs ED Triage Vitals  Enc Vitals Group     BP 05/25/20 1036 (!) 161/94     Pulse Rate 05/25/20 1036 81     Resp 05/25/20 1036 17     Temp 05/25/20 1036 98.2 F (36.8 C)     Temp Source 05/25/20 1036 Oral     SpO2 05/25/20 1036 99 %     Weight --      Height --      Head Circumference --      Peak Flow --      Pain Score 05/25/20 1034 0     Pain Loc --      Pain Edu? --      Excl. in GC? --    No data found.  Updated Vital Signs BP (!) 161/94 (BP Location: Right Arm)    Pulse 81    Temp 98.2 F (36.8 C) (Oral)    Resp 17    SpO2 99%   Visual Acuity Right Eye Distance:   Left Eye Distance:   Bilateral Distance:    Right Eye Near:   Left Eye Near:    Bilateral Near:     Physical Exam Vitals and nursing note reviewed.  Constitutional:      Appearance: He is well-developed.     Comments: No acute distress  HENT:     Head: Normocephalic and atraumatic.     Ears:     Comments: Bilateral ears without tenderness to palpation of external auricle, tragus and mastoid, EAC's without erythema or swelling, TM's with good bony landmarks and cone of light. Non erythematous.     Nose: Nose  normal.     Mouth/Throat:     Comments: Oral mucosa pink and moist, no tonsillar enlargement or exudate. Posterior pharynx patent and nonerythematous, no uvula deviation or swelling. Normal phonation. No obvious signs of thrush noted on tongue or posterior pharynx Eyes:     Extraocular Movements: Extraocular  movements intact.     Conjunctiva/sclera: Conjunctivae normal.     Pupils: Pupils are equal, round, and reactive to light.  Cardiovascular:     Rate and Rhythm: Normal rate.  Pulmonary:     Effort: Pulmonary effort is normal. No respiratory distress.     Comments: Breathing comfortably at rest, CTABL, no wheezing, rales or other adventitious sounds auscultated  Abdominal:     General: There is no distension.     Comments: Soft, nondistended, nontender to palpation  Musculoskeletal:        General: Normal range of motion.     Cervical back: Neck supple.  Skin:    General: Skin is warm and dry.  Neurological:     Mental Status: He is alert and oriented to person, place, and time.      UC Treatments / Results  Labs (all labs ordered are listed, but only abnormal results are displayed) Labs Reviewed  COMPREHENSIVE METABOLIC PANEL - Abnormal; Notable for the following components:      Result Value   Glucose, Bld 102 (*)    All other components within normal limits  CBC WITH DIFFERENTIAL/PLATELET  TSH  VITAMIN D 25 HYDROXY (VIT D DEFICIENCY, FRACTURES)  POCT URINALYSIS DIP (DEVICE)    EKG   Radiology DG Chest 2 View  Result Date: 05/25/2020 CLINICAL DATA:  Fatigue for 1 month. History of smoking. Chest discomfort EXAM: CHEST - 2 VIEW COMPARISON:  10/24/2018 FINDINGS: The heart size and mediastinal contours are within normal limits. Both lungs are clear. The visualized skeletal structures are unremarkable. IMPRESSION: Negative chest. Electronically Signed   By: Marnee Spring M.D.   On: 05/25/2020 11:29    Procedures Procedures (including critical care  time)  Medications Ordered in UC Medications - No data to display  Initial Impression / Assessment and Plan / UC Course  I have reviewed the triage vital signs and the nursing notes.  Pertinent labs & imaging results that were available during my care of the patient were reviewed by me and considered in my medical decision making (see chart for details).     1. Fatigue Chest x-ray negative, basic labs pending for TSH, vitamin D, CBC and CMP.  Will call with results if abnormal.  Follow-up with PCP for further evaluation if symptoms persisting.  Discussed general precautions/recommendations.  2.  Sore throat No obvious thrush, but given feeling similar will provide clotrimazole troches to use with close monitoring.  May be related to viral URI/postnasal drainage.  Discussed recommendations for this as well.  Discussed strict return precautions. Patient verbalized understanding and is agreeable with plan.  Final Clinical Impressions(s) / UC Diagnoses   Final diagnoses:  Sore throat  Fatigue, unspecified type     Discharge Instructions     Fatigue Chest Xray normal Urine normal Blood work pending to check electrlytes, blood levels, thyroid and vitamin D  Continue to try and drink plenty of fluids  Sore thorat Overall throat appears normal Clotrimazole troches 5 times daily to cover for thrush May also try honey/hot tea, daily zyrtec/claritin, tylenol and ibuprofen  Follow up with primary care   ED Prescriptions    Medication Sig Dispense Auth. Provider   clotrimazole (MYCELEX) 10 MG troche Take 1 tablet (10 mg total) by mouth 5 (five) times daily. [address] Elston [place], [place] C, PA-C     PDMP not reviewed this encounter.   Lew Dawes, [place] 05/26/20 1540

## 2020-07-19 ENCOUNTER — Ambulatory Visit (HOSPITAL_COMMUNITY)
Admission: EM | Admit: 2020-07-19 | Discharge: 2020-07-19 | Disposition: A | Discharge status: Home / Self Care | Payer: No Typology Code available for payment source | Attending: Family Medicine | Admitting: Family Medicine

## 2020-07-19 ENCOUNTER — Encounter (HOSPITAL_COMMUNITY): Payer: Self-pay

## 2020-07-19 ENCOUNTER — Other Ambulatory Visit: Payer: Self-pay

## 2020-07-19 DIAGNOSIS — S61210A Laceration without foreign body of right index finger without damage to nail, initial encounter: Secondary | ICD-10-CM

## 2020-07-19 MED ORDER — LIDOCAINE HCL (PF) 2 % IJ SOLN
INTRAMUSCULAR | Status: AC
  Filled 2020-07-19: qty 5

## 2020-07-19 MED ORDER — MORPHINE SULFATE (PF) 4 MG/ML IV SOLN
INTRAVENOUS | Status: AC
  Filled 2020-07-19: qty 1

## 2020-07-19 MED ORDER — TRAMADOL HCL 50 MG PO TABS: 50.0000 mg | ORAL_TABLET | Freq: Four times a day (QID) | ORAL | 0 refills | Status: AC | PRN

## 2020-07-19 MED ORDER — MORPHINE SULFATE (PF) 4 MG/ML IV SOLN
4.0000 mg | Freq: Once | INTRAVENOUS | Status: AC
  Administered 2020-07-19: 4 mg via INTRAMUSCULAR

## 2020-07-19 MED ORDER — BACITRACIN ZINC 500 UNIT/GM EX OINT
TOPICAL_OINTMENT | CUTANEOUS | Status: AC
  Filled 2020-07-19: qty 4.5

## 2020-07-19 NOTE — ED Provider Notes (Signed)
[facility]    CSN: [id number] Arrival date & time: 07/19/20  1055      History   Chief Complaint Chief Complaint  Patient presents with  . Laceration    HPI Dustin Wright is a 39 y.o. male presenting for evaluation of a right finger laceration.  Reports a metal box scraped across his his finger earlier this morning and cut it open on the dorsal surface.  Initially bled a lot, put on several Band-Aids with cessation.  Went to work afterwards, however due to continued pains with moving his finger decided to seek evaluation.  No associated numbness/tingling, or weakness.  Needs finger dexterity for his work in [employer].  He is extremely nervous with needles.  He does take biologic therapy for psoriasis.  Tdap vaccination in 03/2017.   Past Medical History:  Diagnosis Date  . ADHD (attention deficit hyperactivity disorder)    per report, no records, reports taking adderall in the past    . Arthritis   . Chronic lower back pain   . Kidney stone   . Kidney stone     Patient Active Problem List   Diagnosis Date Noted  . 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 Chronic fatigue- likely due to caffeine overuse/withdrawal 12/23/2019  . Chronic tension-type headache, not intractable-every morning due to caffeine withdrawal 12/23/2019  . Dental cavity 04/12/2017  . Tobacco dependence 04/12/2017  . Chronic dental pain 04/04/2016  . Right ureteral stone 04/04/2016  . History of ADHD 04/04/2016  . Psoriasis 04/04/2016    Past Surgical History:  Procedure Laterality Date  . HERNIA REPAIR         Home Medications    Prior to Admission medications   Medication Sig Start Date End Date Taking? Authorizing Provider  BIOTIN PO Take 1 capsule by mouth daily.     [provider]  clotrimazole (MYCELEX) 10 MG troche Take 1 tablet (10 mg total) by mouth 5 (five) times daily. 05/25/20   Wieters, Hallie C, PA-C    Misc Natural Products (GINKOGIN PO) Take by mouth.    [provider]  Multiple Vitamin (MULTIVITAMIN) capsule Take 1 capsule by mouth daily.    [provider]  Omega-3 Fatty Acids (FISH OIL PO) Take 1,200 mg by mouth daily.     [provider]  OVER THE COUNTER MEDICATION Test 180 boost take 4 tabs daily    [provider]  traMADol (ULTRAM) 50 MG tablet Take 1 tablet (50 mg total) by mouth every 6 (six) hours as needed. 07/19/20 07/19/21  Allayne Stack, DO  varenicline (CHANTIX STARTING MONTH PAK) 0.5 MG X 11 & 1 MG X 42 tablet Take one 0.5mg  tablet by mouth once daily for 3 days, then increase to one 0.5mg  tablet twice daily for 3 days, then increase to one 1mg  tablet twice daily. 12/23/19   02/20/20, DO  varenicline (CHANTIX) 1 MG tablet Take 1 tablet (1 mg total) by mouth 2 (two) times daily. 12/23/19   02/20/20, DO    Family History Family History  Problem Relation Age of Onset  . Hypertension Mother     Social History Social History   Tobacco Use  . Smoking status: Former Smoker    Packs/day: 1.00    Years: 11.00    Pack years: 11.00    Types: Cigarettes  . Smokeless tobacco: Current User  Vaping Use  . Vaping Use: Every day  Substance Use Topics  . Alcohol use: Yes    Comment: OCC  . Drug use: No    Frequency: 3.0 times per week    Comment: opiods-clean for 4 month     Allergies   Patient has no known allergies.   Review of Systems Review of Systems   Physical Exam Triage Vital Signs ED Triage Vitals  Enc Vitals Group     BP 07/19/20 1333 (!) 156/88     Pulse Rate 07/19/20 1333 91     Resp 07/19/20 1333 18     Temp 07/19/20 1333 98.2 F (36.8 C)     Temp Source 07/19/20 1333 Oral     SpO2 07/19/20 1333 100 %     Weight 07/19/20 1331 185 lb (83.9 kg)     Height --      Head Circumference --      Peak Flow --      Pain Score 07/19/20 1330 7     Pain Loc --      Pain Edu? --      Excl. in GC? --     No data found.  Updated Vital Signs BP (!) 156/88 (BP Location: Right Arm)   Pulse 91   Temp 98.2 F (36.8 C) (Oral)   Resp 18   Wt 83.9 kg   SpO2 100%   BMI 25.80 kg/m    Physical Exam Constitutional:      Appearance: Normal appearance.  Pulmonary:     Effort: Pulmonary effort is normal.  Musculoskeletal:     Comments: Right index finger with an approximate 2cm flap laceration overlying PIP.  Picture below before and after.  Full ROM of all fingers with sensation to light touch intact throughout hand.  Able to flex/extend through PIP and DIP.  Palpable radial pulses bilaterally.  Skin:    General: Skin is warm and dry.     Capillary Refill: Capillary refill takes less than 2 seconds.     Findings: No bruising.  Neurological:     Mental Status: He is alert.  Psychiatric:     Comments: Anxious mood and affect          UC Treatments / Results  Labs (all labs ordered are listed, but only abnormal results are displayed) Labs Reviewed - No data to display  EKG   Radiology No results found.  Procedures Laceration Repair  Date/Time: 07/19/2020 3:48 PM Performed by: Allayne Stack, DO Authorized by: Mardella Layman, MD   Consent:    Consent obtained:  Verbal   Consent given by:  Patient   Risks discussed:  Infection, pain and poor cosmetic result Anesthesia (see MAR for exact dosages):    Anesthesia method:  Topical application Laceration details:    Location:  Finger   Finger location:  R index finger   Length (cm):  2 Repair type:    Repair type:  Simple Pre-procedure details:    Preparation:  Patient was prepped and draped in usual sterile fashion Exploration:    Wound exploration: entire depth of wound probed and visualized     Wound extent: no nerve damage noted, no tendon damage noted and no vascular damage noted   Treatment:    Area cleansed with:  Saline and Shur-Clens   Amount of cleaning:  Standard   Irrigation solution:  Sterile  saline   Irrigation method:  Syringe   Visualized foreign bodies/material removed: no   Skin repair:    Repair method:  Sutures   Suture size:  5-0   Suture material:  Prolene   Suture technique:  Simple interrupted   Number of sutures:  5 Approximation:    Approximation:  Close Post-procedure details:    Dressing:  Antibiotic ointment and non-adherent dressing   Patient tolerance of procedure:  Tolerated well, no immediate complications   (including critical care time)  Medications Ordered in UC Medications  morphine 4 MG/ML injection 4 mg (4 mg Intramuscular Given 07/19/20 1451)    Initial Impression / Assessment and Plan / UC Course  I have reviewed the triage vital signs and the nursing notes.  Pertinent labs & imaging results that were available during my care of the patient were reviewed by me and considered in my medical decision making (see chart for details).   39 year old gentleman presenting with a right index finger flap laceration.  Reassuringly finger appears neurovascularly intact with full range of motion.  No evidence of foreign body or any severe trauma to suggest concern for underlying fracture.  Repaired with simple interrupted sutures, 5 sutures placed.  Dressed with antibiotic ointment. Suture removal in 10-14 days with PCP. S/sx of infection were discussed.  Discussed appropriate wound care and Rx'd short course of tramadol if needed for severe pain above Tylenol/ibuprofen.   Final Clinical Impressions(s) / UC Diagnoses   Final diagnoses:  Laceration of right index finger without foreign body without damage to nail, initial encounter     Discharge Instructions     It was wonderful to see you today.  We repaired a cut on your finger today.  Please follow-up with your primary care provider in the next 10-14 days for suture removal.  Please use Tylenol 650 mg or ibuprofen 400 mg alternated every 4-6 hours as needed for discomfort in your finger.  I have sent  in a few tablets of stronger medicine if absolutely needed, do not use these if you will be driving or at work.  If you have any skin redness, drainage, or increased pain around the area or with fever please follow-up.      ED Prescriptions    Medication Sig Dispense Auth. Provider   traMADol (ULTRAM) 50 MG tablet Take 1 tablet (50 mg total) by mouth every 6 (six) hours as needed. 5 tablet Allayne Stack, DO     I have reviewed the PDMP during this encounter.   Allayne Stack, DO 07/19/20 1557

## 2020-07-19 NOTE — Discharge Instructions (Addendum)
It was wonderful to see you today.  We repaired a cut on your finger today.  Please follow-up with your primary care provider in the next 10-14 days for suture removal.  Please use Tylenol 650 mg or ibuprofen 400 mg alternated every 4-6 hours as needed for discomfort in your finger.  I have sent in a few tablets of stronger medicine if absolutely needed, do not use these if you will be driving or at work.  If you have any skin redness, drainage, or increased pain around the area or with fever please follow-up.

## 2020-07-19 NOTE — ED Triage Notes (Addendum)
Pt is here with a right laceration that happened after here a metal box falling & scrapping his index finger at 6am.

## 2020-08-04 ENCOUNTER — Other Ambulatory Visit: Payer: Self-pay | Admitting: Family Medicine

## 2020-08-04 DIAGNOSIS — Z716 Tobacco abuse counseling: Secondary | ICD-10-CM

## 2020-08-04 DIAGNOSIS — F17200 Nicotine dependence, unspecified, uncomplicated: Secondary | ICD-10-CM

## 2020-08-21 IMAGING — CR DG CHEST 2V
2 series · 2 of 2 positions shown · non-contrast
Comparison: 10/28/2009

CLINICAL DATA: Severe left chest and arm pain.

EXAM:
CHEST - 2 VIEW

[chest pa]
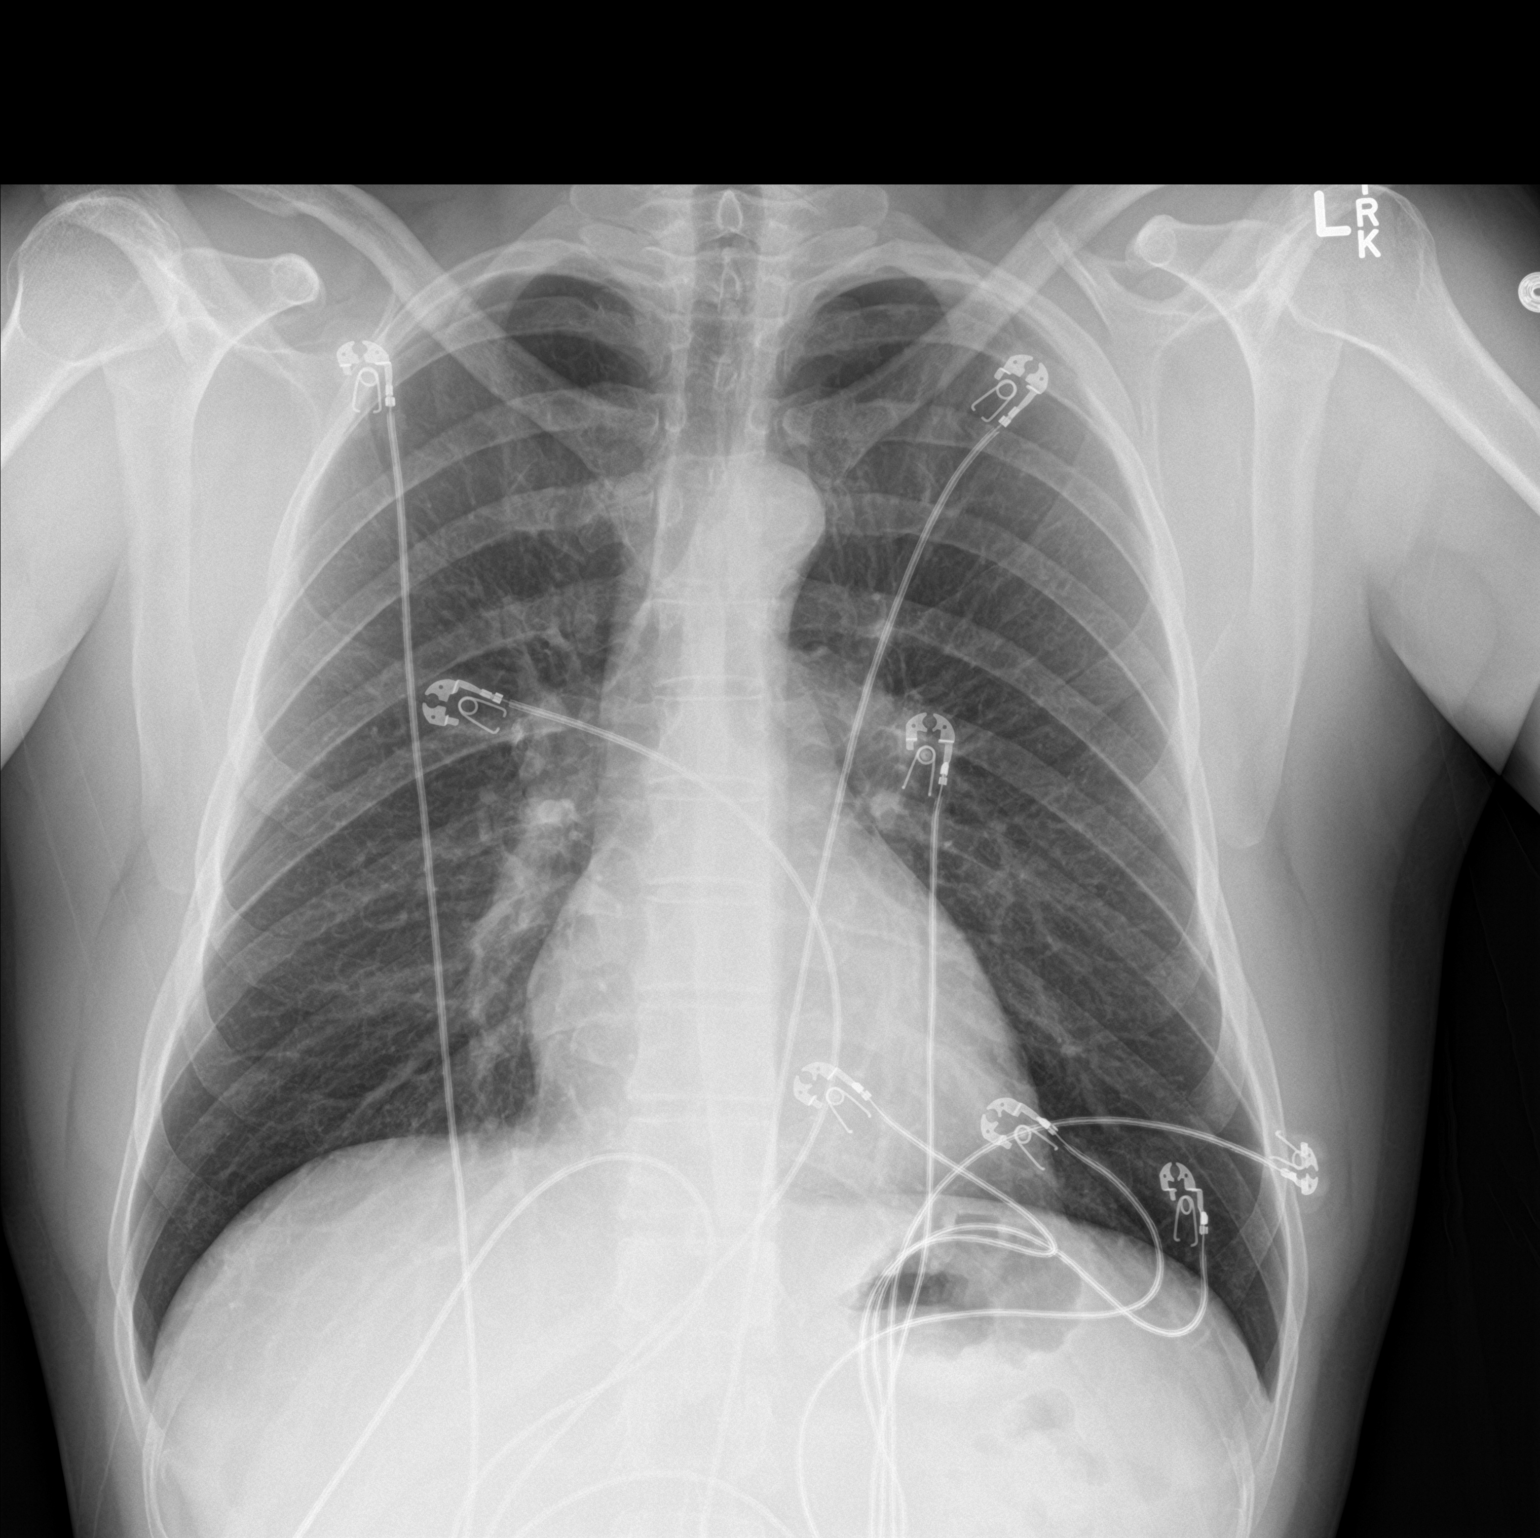

[chest lat]
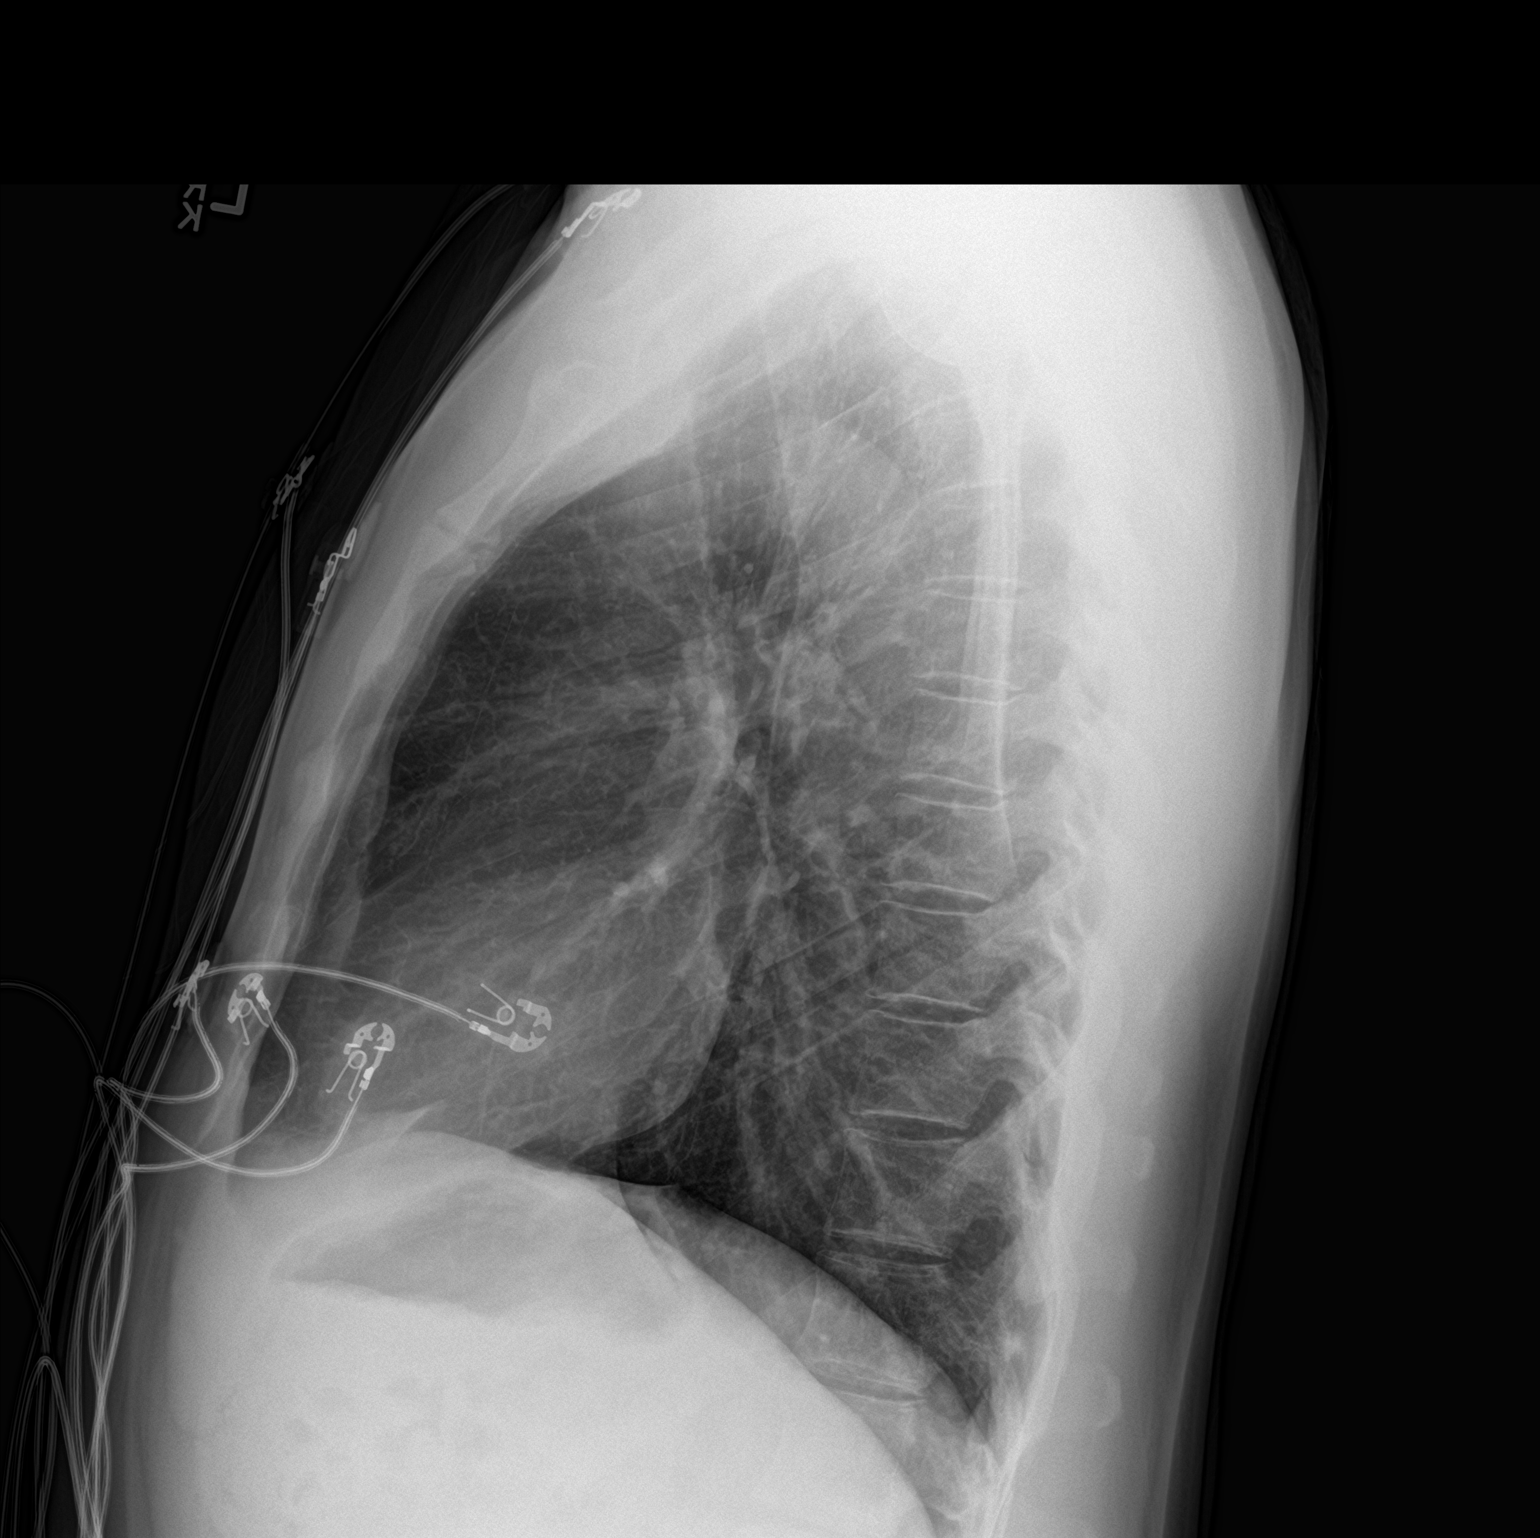

[2 of 2 positions shown; findings below may reference images not displayed]

FINDINGS: The heart size and mediastinal contours are within normal limits.
Both lungs are clear. Moderate hyperinflation. The visualized
skeletal structures are unremarkable.
IMPRESSION: Hyperinflation but no obvious bullous changes.

No acute pulmonary findings.

## 2022-03-23 IMAGING — DX DG CHEST 2V
2 series · 2 of 2 positions shown · non-contrast
Comparison: 10/24/2018

CLINICAL DATA: Fatigue for 1 month. History of smoking. Chest
discomfort

EXAM:
CHEST - 2 VIEW

[chest pa]
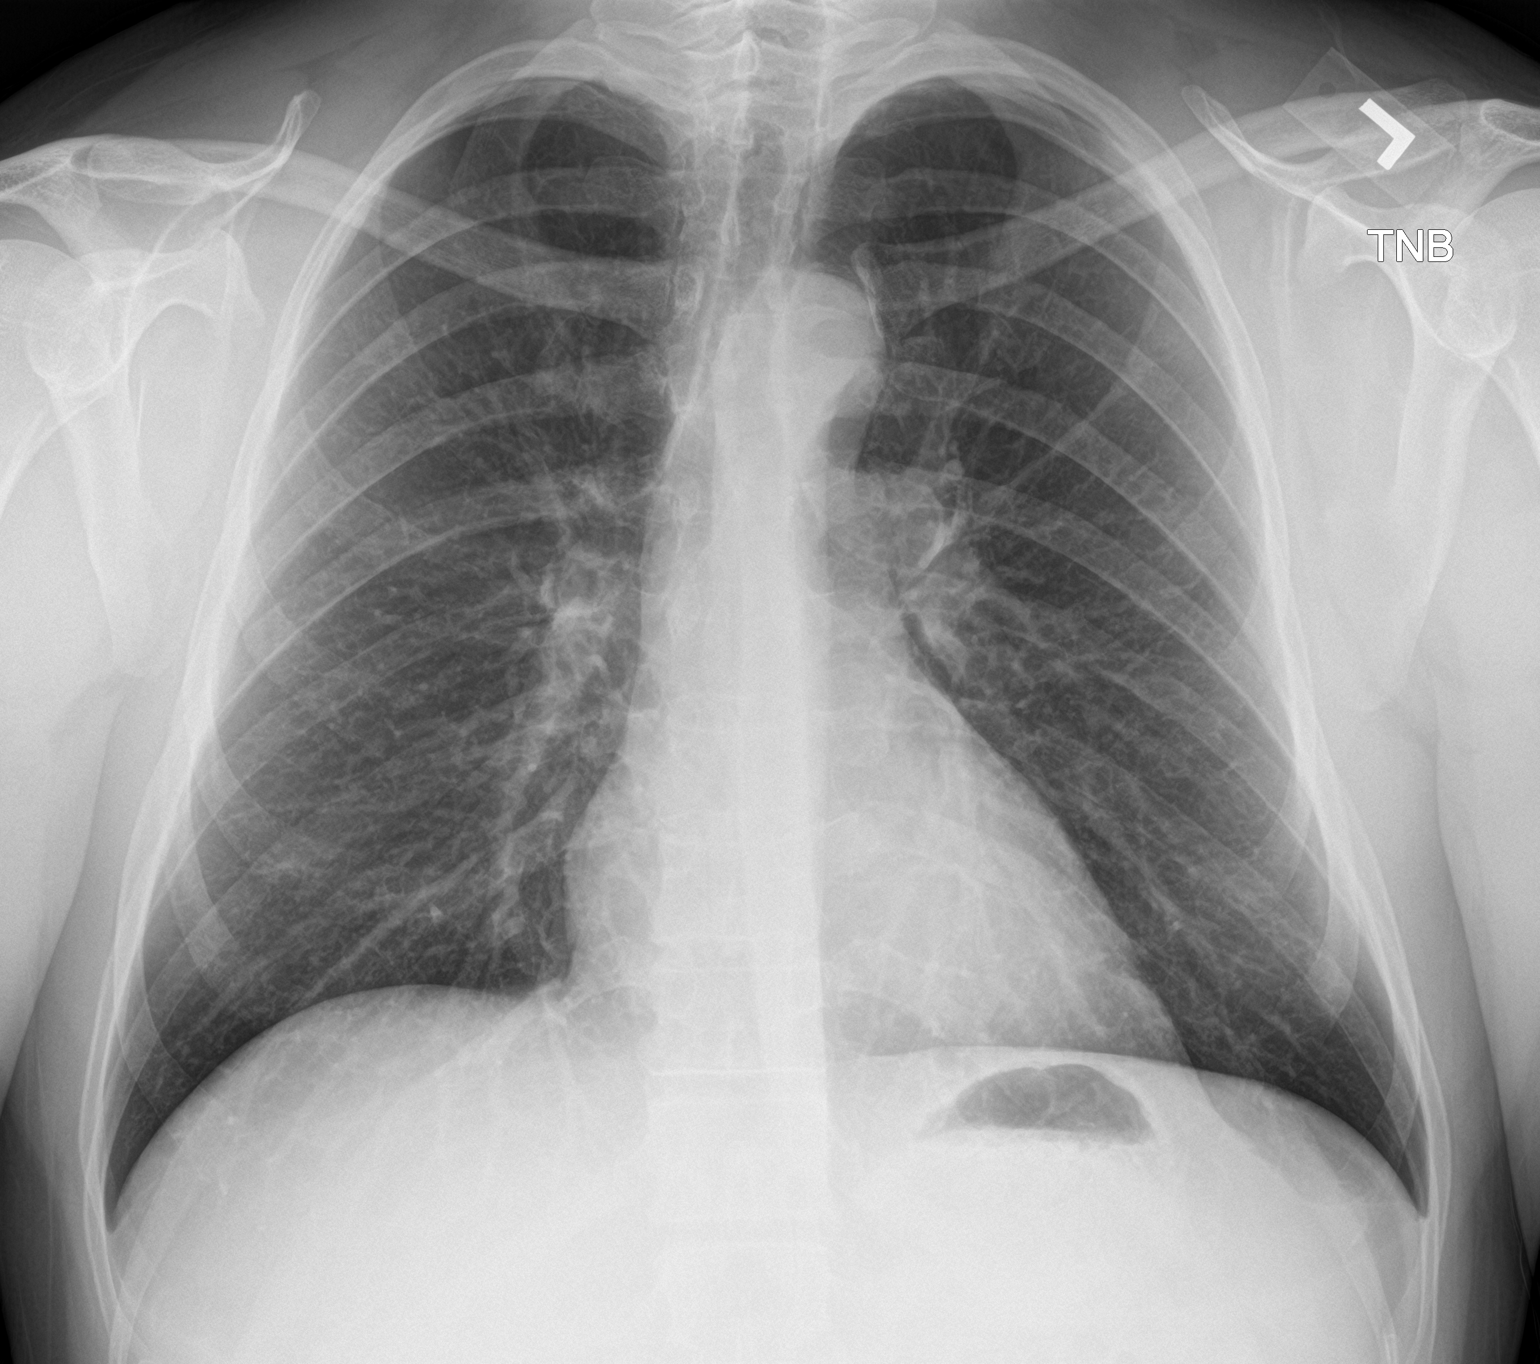

[chest lat]
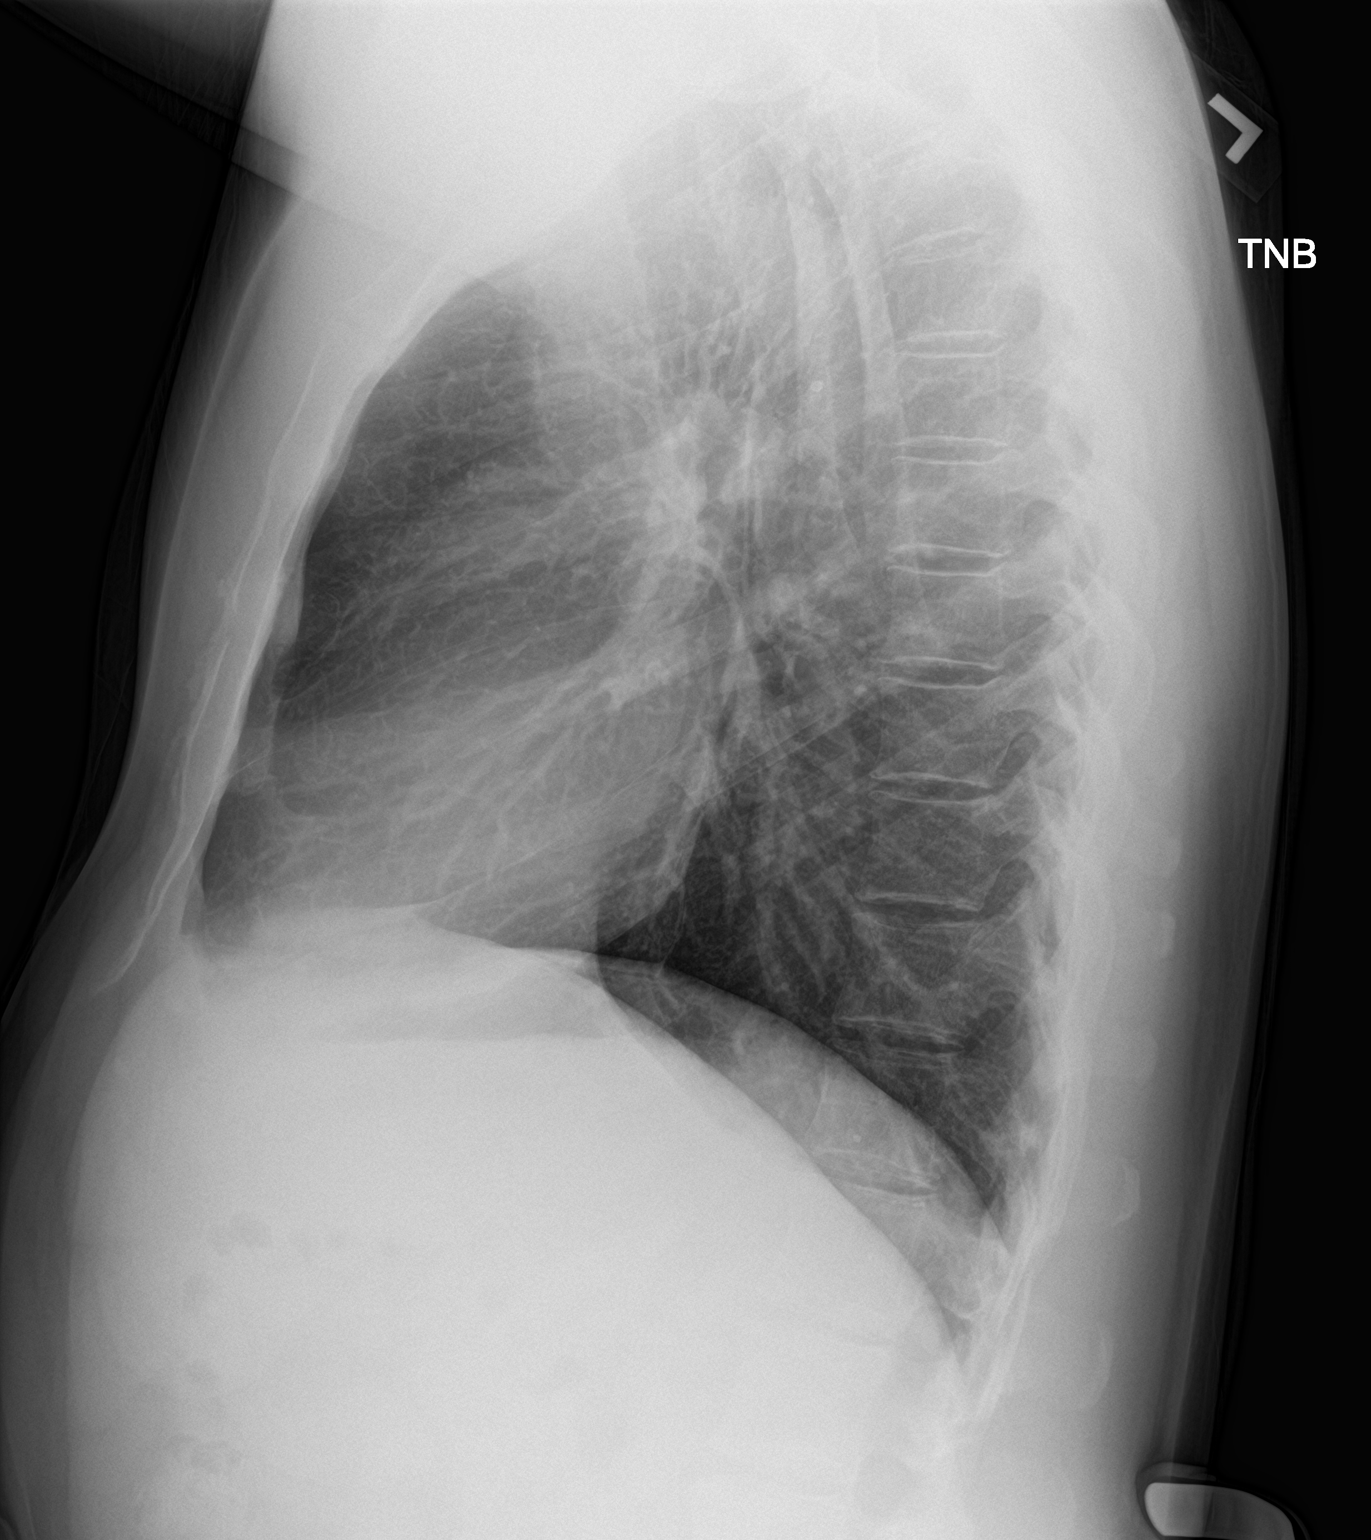

[2 of 2 positions shown; findings below may reference images not displayed]

FINDINGS: The heart size and mediastinal contours are within normal limits.
Both lungs are clear. The visualized skeletal structures are
unremarkable.
IMPRESSION: Negative chest.

## 2023-06-14 NOTE — Progress Notes (Signed)
This encounter was created in error - please disregard.

## 2024-05-07 ENCOUNTER — Other Ambulatory Visit (HOSPITAL_COMMUNITY): Payer: Self-pay | Admitting: Family Medicine

## 2024-05-07 DIAGNOSIS — I2089 Other forms of angina pectoris: Secondary | ICD-10-CM

## 2024-05-15 ENCOUNTER — Telehealth (HOSPITAL_COMMUNITY): Payer: Self-pay | Admitting: *Deleted

## 2024-05-15 MED ORDER — METOPROLOL TARTRATE 100 MG PO TABS: ORAL_TABLET | ORAL | 0 refills | Status: AC

## 2024-05-15 NOTE — Telephone Encounter (Signed)
 Reaching out to patient to offer assistance regarding upcoming cardiac imaging study; pt verbalizes understanding of appt date/time, parking situation and where to check in, pre-test NPO status and medications ordered, and verified current allergies; name and call back number provided for further questions should they arise  Chantal Requena RN Navigator Cardiac Imaging [facility] [phone] office [phone] cell  Patient reports HR normally in 80's-90s. He is to take 100mg  metoprolol tartrate two hours prior to his cardiac CT scan.

## 2024-05-16 ENCOUNTER — Ambulatory Visit (HOSPITAL_COMMUNITY): Admission: RE | Admit: 2024-05-16 | Source: Ambulatory Visit

## 2024-05-21 ENCOUNTER — Encounter (HOSPITAL_COMMUNITY): Payer: Self-pay

## 2024-05-26 ENCOUNTER — Ambulatory Visit (HOSPITAL_COMMUNITY): Admission: RE | Admit: 2024-05-26 | Source: Ambulatory Visit

## 2024-05-26 ENCOUNTER — Encounter (HOSPITAL_COMMUNITY): Payer: Self-pay

## 2024-06-02 ENCOUNTER — Ambulatory Visit (HOSPITAL_COMMUNITY)

## 2024-06-30 ENCOUNTER — Telehealth (HOSPITAL_COMMUNITY): Payer: Self-pay | Admitting: *Deleted

## 2024-06-30 NOTE — Telephone Encounter (Signed)
 Attempted to call patient regarding upcoming cardiac CT appointment. Left message on voicemail with name and callback number Sid Seats RN Navigator Cardiac Imaging [facility] [phone] Office

## 2024-06-30 NOTE — Telephone Encounter (Signed)
 Reaching out to patient to offer assistance regarding upcoming cardiac imaging study; pt verbalizes understanding of appt date/time, parking situation and where to check in, pre-test NPO status and medications ordered, and verified current allergies; name and call back number provided for further questions should they arise Sid Seats RN Navigator Cardiac Imaging [facility] [phone] office [phone] cell

## 2024-07-01 ENCOUNTER — Ambulatory Visit (HOSPITAL_COMMUNITY)
Admission: RE | Admit: 2024-07-01 | Discharge: 2024-07-01 | Disposition: A | Discharge status: Home / Self Care | Source: Ambulatory Visit | Attending: Family Medicine | Admitting: Family Medicine

## 2024-07-01 DIAGNOSIS — R0789 Other chest pain: Secondary | ICD-10-CM | POA: Diagnosis present

## 2024-07-01 DIAGNOSIS — I2089 Other forms of angina pectoris: Secondary | ICD-10-CM

## 2024-07-01 DIAGNOSIS — I25119 Atherosclerotic heart disease of native coronary artery with unspecified angina pectoris: Secondary | ICD-10-CM | POA: Insufficient documentation

## 2024-07-01 MED ORDER — IOHEXOL 350 MG/ML SOLN
100.0000 mL | Freq: Once | INTRAVENOUS | Status: AC | PRN
  Administered 2024-07-01 (×2): 100 mL via INTRAVENOUS

## 2024-07-01 MED ORDER — NITROGLYCERIN 0.4 MG SL SUBL
0.8000 mg | SUBLINGUAL_TABLET | Freq: Once | SUBLINGUAL | Status: AC
  Administered 2024-07-01 (×2): 0.8 mg via SUBLINGUAL

## 2024-07-04 ENCOUNTER — Other Ambulatory Visit: Payer: Self-pay

## 2024-07-04 DIAGNOSIS — N62 Hypertrophy of breast: Secondary | ICD-10-CM

## 2024-07-04 DIAGNOSIS — N644 Mastodynia: Secondary | ICD-10-CM

## 2024-07-24 ENCOUNTER — Other Ambulatory Visit

## 2024-07-24 ENCOUNTER — Encounter

## 2024-08-01 ENCOUNTER — Other Ambulatory Visit

## 2024-08-01 ENCOUNTER — Encounter

## 2024-08-14 ENCOUNTER — Other Ambulatory Visit

## 2024-08-14 ENCOUNTER — Encounter

## 2024-08-29 ENCOUNTER — Other Ambulatory Visit

## 2024-08-29 ENCOUNTER — Encounter

## 2024-09-02 ENCOUNTER — Ambulatory Visit

## 2024-09-02 ENCOUNTER — Ambulatory Visit
Admission: RE | Admit: 2024-09-02 | Discharge: 2024-09-02 | Disposition: A | Discharge status: Home / Self Care | Source: Ambulatory Visit

## 2024-09-02 DIAGNOSIS — N644 Mastodynia: Secondary | ICD-10-CM

## 2024-09-02 DIAGNOSIS — N62 Hypertrophy of breast: Secondary | ICD-10-CM
